# Patient Record
Sex: Female | Born: 1940 | Race: White | Hispanic: No | Marital: Married | Smoking: Never smoker
Health system: Southern US, Community
[De-identification: ages and names within clinical notes are randomized; demographics above are authoritative.]

## PROBLEM LIST (undated history)

## (undated) DIAGNOSIS — I341 Nonrheumatic mitral (valve) prolapse: Secondary | ICD-10-CM

## (undated) DIAGNOSIS — I10 Essential (primary) hypertension: Secondary | ICD-10-CM

## (undated) DIAGNOSIS — M810 Age-related osteoporosis without current pathological fracture: Secondary | ICD-10-CM

## (undated) DIAGNOSIS — N281 Cyst of kidney, acquired: Secondary | ICD-10-CM

## (undated) HISTORY — DX: Hypercalcemia: E83.52

## (undated) HISTORY — PX: TONSILLECTOMY: SUR1361

## (undated) HISTORY — DX: Nonrheumatic mitral (valve) prolapse: I34.1

## (undated) HISTORY — DX: Cyst of kidney, acquired: N28.1

## (undated) HISTORY — DX: Age-related osteoporosis without current pathological fracture: M81.0

---

## 2010-05-26 HISTORY — PX: GANGLION CYST EXCISION: SHX1691

## 2011-04-25 DIAGNOSIS — L821 Other seborrheic keratosis: Secondary | ICD-10-CM | POA: Diagnosis not present

## 2011-04-25 DIAGNOSIS — L570 Actinic keratosis: Secondary | ICD-10-CM | POA: Diagnosis not present

## 2011-05-20 DIAGNOSIS — Z20820 Contact with and (suspected) exposure to varicella: Secondary | ICD-10-CM | POA: Diagnosis not present

## 2011-05-20 DIAGNOSIS — E119 Type 2 diabetes mellitus without complications: Secondary | ICD-10-CM | POA: Diagnosis not present

## 2011-05-20 DIAGNOSIS — D509 Iron deficiency anemia, unspecified: Secondary | ICD-10-CM | POA: Diagnosis not present

## 2011-05-20 DIAGNOSIS — M109 Gout, unspecified: Secondary | ICD-10-CM | POA: Diagnosis not present

## 2011-05-20 DIAGNOSIS — E079 Disorder of thyroid, unspecified: Secondary | ICD-10-CM | POA: Diagnosis not present

## 2011-05-20 DIAGNOSIS — K769 Liver disease, unspecified: Secondary | ICD-10-CM | POA: Diagnosis not present

## 2011-05-20 DIAGNOSIS — R0609 Other forms of dyspnea: Secondary | ICD-10-CM | POA: Diagnosis not present

## 2011-05-20 DIAGNOSIS — E559 Vitamin D deficiency, unspecified: Secondary | ICD-10-CM | POA: Diagnosis not present

## 2011-05-20 DIAGNOSIS — E782 Mixed hyperlipidemia: Secondary | ICD-10-CM | POA: Diagnosis not present

## 2011-07-16 DIAGNOSIS — L570 Actinic keratosis: Secondary | ICD-10-CM | POA: Diagnosis not present

## 2011-08-06 DIAGNOSIS — R0602 Shortness of breath: Secondary | ICD-10-CM | POA: Diagnosis not present

## 2011-08-06 DIAGNOSIS — R319 Hematuria, unspecified: Secondary | ICD-10-CM | POA: Diagnosis not present

## 2011-08-06 DIAGNOSIS — J309 Allergic rhinitis, unspecified: Secondary | ICD-10-CM | POA: Diagnosis not present

## 2011-08-06 DIAGNOSIS — I10 Essential (primary) hypertension: Secondary | ICD-10-CM | POA: Diagnosis not present

## 2011-08-06 DIAGNOSIS — M818 Other osteoporosis without current pathological fracture: Secondary | ICD-10-CM | POA: Diagnosis not present

## 2011-08-06 DIAGNOSIS — Z0000 Encounter for general adult medical examination without abnormal findings: Secondary | ICD-10-CM | POA: Diagnosis not present

## 2011-08-06 DIAGNOSIS — I4589 Other specified conduction disorders: Secondary | ICD-10-CM | POA: Diagnosis not present

## 2011-08-06 DIAGNOSIS — R918 Other nonspecific abnormal finding of lung field: Secondary | ICD-10-CM | POA: Diagnosis not present

## 2011-10-21 ENCOUNTER — Emergency Department (HOSPITAL_BASED_OUTPATIENT_CLINIC_OR_DEPARTMENT_OTHER)
Admission: EM | Admit: 2011-10-21 | Discharge: 2011-10-21 | Disposition: A | Discharge status: Home / Self Care | Payer: Medicare Other | Attending: Emergency Medicine | Admitting: Emergency Medicine

## 2011-10-21 ENCOUNTER — Encounter (HOSPITAL_BASED_OUTPATIENT_CLINIC_OR_DEPARTMENT_OTHER): Payer: Self-pay

## 2011-10-21 DIAGNOSIS — T148 Other injury of unspecified body region: Secondary | ICD-10-CM | POA: Diagnosis not present

## 2011-10-21 DIAGNOSIS — Z880 Allergy status to penicillin: Secondary | ICD-10-CM | POA: Diagnosis not present

## 2011-10-21 DIAGNOSIS — S90569A Insect bite (nonvenomous), unspecified ankle, initial encounter: Secondary | ICD-10-CM | POA: Diagnosis not present

## 2011-10-21 DIAGNOSIS — W57XXXA Bitten or stung by nonvenomous insect and other nonvenomous arthropods, initial encounter: Secondary | ICD-10-CM | POA: Insufficient documentation

## 2011-10-21 DIAGNOSIS — I10 Essential (primary) hypertension: Secondary | ICD-10-CM | POA: Diagnosis not present

## 2011-10-21 DIAGNOSIS — S30860A Insect bite (nonvenomous) of lower back and pelvis, initial encounter: Secondary | ICD-10-CM | POA: Diagnosis not present

## 2011-10-21 HISTORY — DX: Essential (primary) hypertension: I10

## 2011-10-21 MED ORDER — TRIAMCINOLONE ACETONIDE 0.1 % EX CREA: TOPICAL_CREAM | Freq: Two times a day (BID) | CUTANEOUS | Status: DC

## 2011-10-21 NOTE — ED Notes (Signed)
Red, raised, urticarial rash on extremities and back.  Onset yesterday.

## 2011-10-21 NOTE — ED Provider Notes (Signed)
History     CSN: [id number]  Arrival date & time 10/21/11  1133   First MD Initiated Contact with Patient 10/21/11 1205      Chief Complaint  Patient presents with  . Rash    (Consider location/radiation/quality/duration/timing/severity/associated sxs/prior treatment) Patient is a 71 y.o. female presenting with rash. The history is provided by the patient. No language interpreter was used.  Rash  This is a new problem. The current episode started yesterday. The problem has not changed since onset.The problem is associated with nothing. There has been no fever. Associated symptoms include itching. She has tried nothing for the symptoms.   Pt complains of multiple bites right back and left inner thigh.  (Pt's husband worried that pt would get some type of infection in her blood) Past Medical History  Diagnosis Date  . Hypertension     History reviewed. No pertinent past surgical history.  No family history on file.  History  Substance Use Topics  . Smoking status: Never Smoker   . Smokeless tobacco: Never Used  . Alcohol Use: 0.6 oz/week    1 Glasses of wine per week     daily    OB History    Grav Para Term Preterm Abortions TAB SAB Ect Mult Living                  Review of Systems  Skin: Positive for itching and rash.  All other systems reviewed and are negative.    Allergies  Penicillins  Home Medications   Current Outpatient Rx  Name Route Sig Dispense Refill  . B-COMPLEX/B-12 PO Oral Take by mouth daily.    Marland Kitchen BIOTIN PO Oral Take by mouth daily.    Marland Kitchen CALTRATE 600 PO Oral Take by mouth.    . OMEGA-3 FATTY ACIDS 1000 MG PO CAPS Oral Take 1 g by mouth daily.    Marland Kitchen DIOVAN HCT PO Oral Take 20 mg by mouth.      BP 160/110  Pulse 85  Temp 98.2 F (36.8 C) (Oral)  Resp 16  Ht 5\' 6"  (1.676 m)  Wt 130 lb (58.968 kg)  BMI 20.98 kg/m2  SpO2 100%  Physical Exam  Nursing note and vitals reviewed. Constitutional: She appears well-developed and  well-nourished.  HENT:  Head: Normocephalic and atraumatic.  Eyes: Pupils are equal, round, and reactive to light.  Cardiovascular: Normal rate.   Pulmonary/Chest: Effort normal.  Neurological: She is alert.  Skin: There is erythema.       Multiple bites left inner thigh and right back (classic appearance of mosquito bites)  Psychiatric: She has a normal mood and affect.    ED Course  Procedures (including critical care time)  Labs Reviewed - No data to display No results found.   1. Insect bites       MDM  I advised benadryl for itching   traiamcinalone for rash       Lonia Skinner [place], [place] 10/21/11 1245

## 2011-10-21 NOTE — ED Provider Notes (Signed)
Medical screening examination/treatment/procedure(s) were performed by non-physician practitioner and as supervising physician I was immediately available for consultation/collaboration.   Anglea Gordner H Sherilynn Dieu, MD 10/21/11 1531 

## 2012-01-02 DIAGNOSIS — E559 Vitamin D deficiency, unspecified: Secondary | ICD-10-CM | POA: Diagnosis not present

## 2012-01-02 DIAGNOSIS — E119 Type 2 diabetes mellitus without complications: Secondary | ICD-10-CM | POA: Diagnosis not present

## 2012-01-02 DIAGNOSIS — R5381 Other malaise: Secondary | ICD-10-CM | POA: Diagnosis not present

## 2012-01-02 DIAGNOSIS — E079 Disorder of thyroid, unspecified: Secondary | ICD-10-CM | POA: Diagnosis not present

## 2012-01-02 DIAGNOSIS — I10 Essential (primary) hypertension: Secondary | ICD-10-CM | POA: Diagnosis not present

## 2012-01-02 DIAGNOSIS — I059 Rheumatic mitral valve disease, unspecified: Secondary | ICD-10-CM | POA: Diagnosis not present

## 2012-01-02 DIAGNOSIS — E785 Hyperlipidemia, unspecified: Secondary | ICD-10-CM | POA: Diagnosis not present

## 2012-01-02 DIAGNOSIS — Z23 Encounter for immunization: Secondary | ICD-10-CM | POA: Diagnosis not present

## 2012-01-02 DIAGNOSIS — R5383 Other fatigue: Secondary | ICD-10-CM | POA: Diagnosis not present

## 2012-01-02 DIAGNOSIS — IMO0001 Reserved for inherently not codable concepts without codable children: Secondary | ICD-10-CM | POA: Diagnosis not present

## 2012-01-02 DIAGNOSIS — R350 Frequency of micturition: Secondary | ICD-10-CM | POA: Diagnosis not present

## 2012-02-25 LAB — HM PAP SMEAR: HM PAP: NORMAL

## 2012-02-25 LAB — HM DEXA SCAN: HM Dexa Scan: NORMAL

## 2012-04-07 ENCOUNTER — Ambulatory Visit (HOSPITAL_BASED_OUTPATIENT_CLINIC_OR_DEPARTMENT_OTHER)
Admission: RE | Admit: 2012-04-07 | Discharge: 2012-04-07 | Disposition: A | Discharge status: Home / Self Care | Payer: Medicare Other | Source: Ambulatory Visit | Attending: Family | Admitting: Family

## 2012-04-07 ENCOUNTER — Ambulatory Visit (INDEPENDENT_AMBULATORY_CARE_PROVIDER_SITE_OTHER): Payer: Medicare Other | Admitting: Family

## 2012-04-07 ENCOUNTER — Encounter: Payer: Self-pay | Admitting: Family

## 2012-04-07 VITALS — BP 142/82 | HR 89 | Temp 98.1°F | Resp 16 | Ht 66.0 in | Wt 134.1 lb

## 2012-04-07 DIAGNOSIS — I10 Essential (primary) hypertension: Secondary | ICD-10-CM

## 2012-04-07 DIAGNOSIS — R059 Cough, unspecified: Secondary | ICD-10-CM

## 2012-04-07 DIAGNOSIS — R05 Cough: Secondary | ICD-10-CM

## 2012-04-07 MED ORDER — FLUTICASONE PROPIONATE 50 MCG/ACT NA SUSP: 2.0000 | Freq: Every day | NASAL | Status: DC

## 2012-04-07 NOTE — Progress Notes (Signed)
Subjective:    Patient ID: Mary Stevens, female    DOB: 02-Jun-1940, 72 y.o.   MRN: [id number]  HPI  Mary Stevens is a 72 yr old female here today to establish care. Moved here 18 months ago.    Cough- reports that this stated 2-3 months ago.  Cough is intermittent.  Reports no aggravating or alleviating factors.  Cough is non-productive. She denies fever.  She reports "burping more than she used to."  This started about 3 yrs ago.  She notes that symptoms are improved by align. Denies "heartburn."  She denies post nasal drip or sinus congestion.  She denies wheezing.  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 Reports hx of neg PFT's for asthma as well.    HTN- Se reports that she started treatment 4-5 yrs ago.  Review of Systems See HPI  Past Medical History  Diagnosis Date  . Hypertension     History   Social History  . Marital Status: Married    Spouse Name: N/A    Number of Children: 3  . Years of Education: N/A   Occupational History  . Not on file.   Social History Main Topics  . Smoking status: Never Smoker   . Smokeless tobacco: Never Used  . Alcohol Use: 0.6 oz/week    1 Glasses of wine per week     Comment: daily  . Drug Use: No  . Sexually Active: Not on file   Other Topics Concern  . Not on file   Social History Narrative  . No narrative on file    Past Surgical History  Procedure Laterality Date  . Ganglion cyst excision Right 4 2012    wrist    Family History  Problem Relation Age of Onset  . Heart disease Mother   . Cancer Father     liver  . Diabetes Neg Hx   . Kidney disease Neg Hx   . Asthma Neg Hx     Allergies  Allergen Reactions  . Penicillins Hives    Current Outpatient Prescriptions on File Prior to Visit  Medication Sig Dispense Refill  . B Complex Vitamins (B-COMPLEX/B-12 PO) Take by mouth daily.      Marland Kitchen BIOTIN PO Take by mouth daily.      . Calcium Carbonate (CALTRATE  600 PO) Take by mouth.      . fish oil-omega-3 fatty acids 1000 MG capsule Take 1 g by mouth daily.       No current facility-administered medications on file prior to visit.    BP 142/82  Pulse 89  Temp(Src) 98.1 F (36.7 C) (Oral)  Resp 16  Ht 5\' 6"  (1.676 m)  Wt 134 lb 1.9 oz (60.836 kg)  BMI 21.66 kg/m2  SpO2 99%       Objective:   Physical Exam  Constitutional: She is oriented to person, place, and time. She appears well-developed and well-nourished. No distress.  HENT:  Head: Normocephalic and atraumatic.  Right Ear: Tympanic membrane and ear canal normal.  Left Ear: Tympanic membrane and ear canal normal.  Mouth/Throat: No oropharyngeal exudate, posterior oropharyngeal edema or posterior oropharyngeal erythema.  Cardiovascular: Normal rate and regular rhythm.   No murmur heard. Pulmonary/Chest: Effort normal and breath sounds normal. No respiratory distress. She has no wheezes. She has no rales. She exhibits no tenderness.  Musculoskeletal: She exhibits no edema.  Neurological: She is alert and oriented to  person, place, and time.  Psychiatric: She has a normal mood and affect. Her behavior is normal. Judgment and thought content normal.          Assessment & Plan:

## 2012-04-07 NOTE — Patient Instructions (Addendum)
Please follow up in 4-6 weeks for a fasting physical. Welcome to [facility]!

## 2012-04-07 NOTE — Assessment & Plan Note (Signed)
BP Readings from Last 3 Encounters:  04/07/12 142/82  10/21/11 160/110   Fair BP on current meds.  Continue same, plan to check bmet next visit.

## 2012-04-07 NOTE — Assessment & Plan Note (Signed)
Will obtain cxr to further evaluate.  Rx flonase and claritin to help with post nasal drip- noted pt clearing throat and swallowing frequently during exam.  Also, empiric rx with otc prilosec.

## 2012-04-13 ENCOUNTER — Ambulatory Visit: Payer: Medicare Other | Admitting: Family

## 2012-05-04 ENCOUNTER — Other Ambulatory Visit: Payer: Self-pay | Admitting: Family

## 2012-05-04 ENCOUNTER — Ambulatory Visit (INDEPENDENT_AMBULATORY_CARE_PROVIDER_SITE_OTHER): Payer: Medicare Other | Admitting: Family

## 2012-05-04 ENCOUNTER — Encounter: Payer: Self-pay | Admitting: Family

## 2012-05-04 ENCOUNTER — Other Ambulatory Visit (HOSPITAL_COMMUNITY)
Admission: RE | Admit: 2012-05-04 | Discharge: 2012-05-04 | Disposition: A | Discharge status: Home / Self Care | Payer: Medicare Other | Source: Ambulatory Visit | Attending: Family | Admitting: Family

## 2012-05-04 VITALS — BP 110/76 | HR 77 | Temp 97.8°F | Resp 16 | Ht 66.0 in | Wt 137.0 lb

## 2012-05-04 DIAGNOSIS — I10 Essential (primary) hypertension: Secondary | ICD-10-CM

## 2012-05-04 DIAGNOSIS — Z0000 Encounter for general adult medical examination without abnormal findings: Secondary | ICD-10-CM | POA: Diagnosis not present

## 2012-05-04 DIAGNOSIS — R05 Cough: Secondary | ICD-10-CM | POA: Diagnosis not present

## 2012-05-04 DIAGNOSIS — Z1231 Encounter for screening mammogram for malignant neoplasm of breast: Secondary | ICD-10-CM

## 2012-05-04 DIAGNOSIS — M949 Disorder of cartilage, unspecified: Secondary | ICD-10-CM | POA: Diagnosis not present

## 2012-05-04 DIAGNOSIS — Z1239 Encounter for other screening for malignant neoplasm of breast: Secondary | ICD-10-CM

## 2012-05-04 DIAGNOSIS — M899 Disorder of bone, unspecified: Secondary | ICD-10-CM

## 2012-05-04 DIAGNOSIS — Z01419 Encounter for gynecological examination (general) (routine) without abnormal findings: Secondary | ICD-10-CM

## 2012-05-04 DIAGNOSIS — M8580 Other specified disorders of bone density and structure, unspecified site: Secondary | ICD-10-CM

## 2012-05-04 DIAGNOSIS — Z124 Encounter for screening for malignant neoplasm of cervix: Secondary | ICD-10-CM | POA: Insufficient documentation

## 2012-05-04 DIAGNOSIS — M810 Age-related osteoporosis without current pathological fracture: Secondary | ICD-10-CM

## 2012-05-04 LAB — BASIC METABOLIC PANEL
BUN: 19 mg/dL (ref 6–23)
Chloride: 107 mEq/L (ref 96–112)
Creat: 0.83 mg/dL (ref 0.50–1.10)
Glucose, Bld: 80 mg/dL (ref 70–99)

## 2012-05-04 NOTE — Progress Notes (Signed)
Subjective:    Patient ID: Mary Stevens, female    DOB: [birth year], 72 y.o.   MRN: [id number]  HPI Subjective:   Patient here for Medicare annual wellness visit and management of other chronic and acute problems.  Pt here for fasting medicare wellness exam. Pt reports last eye exam was about 1 yr ago. Pt is unsure of last tetanus, up to date with pneumovax, flu and shingles vaccines. Last colonoscopy 3 years ago, last dexa and mammogram 03/2011.   HTN- she is maintained on diovan. Tolerating.  Denies CP/SOB or swelling.    Cough- last visit she had CXR which showed ? COPD changes, but no acute findings.  Flonase and zyrtec were added to her regimen. She reports improvement in her symptoms.    Due for pap smear today.   Risk factors: ? Increased risk for heart disease given age and HTN.  Roster of Physicians Providing Medical Care to Patient:  cardiology  Activities of Daily Living  In your present state of health, do you have any difficulty performing the following activities? Preparing food and eating?: No  Bathing yourself: No  Getting dressed: No  Using the toilet:No  Moving around from place to place: No  In the past year have you fallen or had a near fall?:No     Home Safety: Has smoke detector and wears seat belts. No firearms. No excess sun exposure.  Diet and Exercise  Current exercise habits: enjoys walking Dietary issues discussed: healthy diet   Depression Screen  (Note: if answer to either of the following is "Yes", then a more complete depression screening is indicated)  Q1: Over the past two weeks, have you felt down, depressed or hopeless?no  Q2: Over the past two weeks, have you felt little interest or pleasure in doing things? no   The following portions of the patient's history were reviewed and updated as appropriate: allergies, current medications, past family history, past medical history, past social history, past surgical history and problem list.     Objective:   Vision: see nursing. Hearing: Able to hear forced whisper at 6pm Body mass index: see nursing. Cognitive Impairment Assessment: cognition, memory and judgment appear normal.   Assessment:   Medicare wellness utd on preventive parameters   Plan:   During the course of the visit the patient was educated and counseled about appropriate screening and preventive services including:   Declines tdap       Pap today.   Screening mammography- ordered Bone densitometry screening- ordered Diabetes screening - oprdered Nutrition counseling   Vaccines / LABS  BMET today   Instructions (the written plan) was given to the patient.       Review of Systems  Constitutional: Negative for unexpected weight change.  HENT: Negative for congestion.   Eyes: Negative for visual disturbance.  Respiratory: Negative for cough.   Cardiovascular: Negative for chest pain and leg swelling.  Gastrointestinal: Negative for nausea, vomiting and diarrhea.  Genitourinary: Negative for frequency.  Musculoskeletal: Negative for arthralgias.  Skin: Negative for rash.  Neurological: Negative for headaches.  Hematological: Negative for adenopathy.  Psychiatric/Behavioral:       Denies depression       Objective:   Physical Exam  Constitutional: She is oriented to person, place, and time. She appears well-developed and well-nourished. No distress.  HENT:  Head: Normocephalic and atraumatic.  Right Ear: Tympanic membrane and ear canal normal.  Left Ear: Tympanic membrane and ear canal normal.  Mouth/Throat: No  oropharyngeal exudate or posterior oropharyngeal edema.  Cardiovascular: Normal rate and regular rhythm.   No murmur heard. Pulmonary/Chest: Effort normal and breath sounds normal. No respiratory distress. She has no wheezes. She has no rales. She exhibits no tenderness.  Musculoskeletal: She exhibits no edema.  Neurological: She is alert and oriented to person, place, and  time.  Skin: Skin is warm and dry. No rash noted. No erythema. No pallor.  Psychiatric: She has a normal mood and affect. Her behavior is normal. Judgment and thought content normal.  Breasts: Examined lying and sitting.  Right: Without masses, retractions, discharge or axillary adenopathy.  Left: Without masses, retractions, discharge or axillary adenopathy.  Inguinal/mons: Normal without inguinal adenopathy  External genitalia: Normal  BUS/Urethra/Skene's glands: Normal  Bladder: Normal  Vagina: Normal- atrophic Cervix: Normal  Uterus: normal in size, shape and contour. Midline and mobile  Adnexa/parametria:  Rt: Without masses or tenderness.  Lt: Without masses or tenderness.  Anus and perineum: Normal          Assessment & Plan:

## 2012-05-04 NOTE — Patient Instructions (Addendum)
Please schedule bone density at the front desk. Schedule mammogram on the first floor.  Follow up in 6 months.

## 2012-05-04 NOTE — Assessment & Plan Note (Signed)
Resolved. Likely due to post nasal drip. Continue flonase and Zyrtec.

## 2012-05-04 NOTE — Assessment & Plan Note (Signed)
Stable on diovan.  Obtain BMET.

## 2012-05-24 ENCOUNTER — Ambulatory Visit (INDEPENDENT_AMBULATORY_CARE_PROVIDER_SITE_OTHER): Payer: Medicare Other | Admitting: Family

## 2012-05-24 ENCOUNTER — Encounter: Payer: Self-pay | Admitting: Family

## 2012-05-24 VITALS — BP 126/80 | HR 77 | Temp 97.8°F | Resp 18 | Ht 66.0 in | Wt 137.0 lb

## 2012-05-24 DIAGNOSIS — R05 Cough: Secondary | ICD-10-CM | POA: Diagnosis not present

## 2012-05-24 DIAGNOSIS — T148XXA Other injury of unspecified body region, initial encounter: Secondary | ICD-10-CM | POA: Diagnosis not present

## 2012-05-24 MED ORDER — MELOXICAM 7.5 MG PO TABS: 7.5000 mg | ORAL_TABLET | Freq: Every day | ORAL | Status: DC

## 2012-05-24 MED ORDER — CYCLOBENZAPRINE HCL 5 MG PO TABS: ORAL_TABLET | ORAL | Status: DC

## 2012-05-24 NOTE — Progress Notes (Signed)
Subjective:    Patient ID: Mary Stevens, female    DOB: [birth year], 72 y.o.   MRN: [id number]  HPI  Ms.  Mary Stevens is a 72 yr old female who presents today with chief complaint of headache.  She reports that pain is located in the left occipital region and is tender to the touch. Reports that symptoms started about 2 weeks ago.  Reports that last Sunday she had some sharp pains.  She denies known injury. Denies fever.   Cough- got bloody nose on flonase- she stopped. Cough is improved with prilosec and zyrtec.    Review of Systems    see HPI  Past Medical History  Diagnosis Date  . Hypertension     History   Social History  . Marital Status: Married    Spouse Name: N/A    Number of Children: 3  . Years of Education: N/A   Occupational History  . Not on file.   Social History Main Topics  . Smoking status: Never Smoker   . Smokeless tobacco: Never Used  . Alcohol Use: 0.6 oz/week    1 Glasses of wine per week     Comment: daily  . Drug Use: No  . Sexually Active: Not on file   Other Topics Concern  . 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      Past Surgical History  Procedure Laterality Date  . Ganglion cyst excision Right 4 2012    wrist    Family History  Problem Relation Age of Onset  . Heart disease Mother   . Cancer Father     liver  . Diabetes Neg Hx   . Kidney disease Neg Hx   . Asthma Neg Hx     Allergies  Allergen Reactions  . Penicillins Hives    Current Outpatient Prescriptions on File Prior to Visit  Medication Sig Dispense Refill  . B Complex Vitamins (B-COMPLEX/B-12 PO) Take by mouth daily.      Marland Kitchen BIOTIN PO Take by mouth daily.      . Calcium Carbonate (CALTRATE 600 PO) Take by mouth.      . cetirizine (ZYRTEC) 10 MG tablet Take 10 mg by mouth daily as needed  for allergies.      . fish oil-omega-3 fatty acids 1000 MG capsule Take 1 g by mouth daily.      Marland Kitchen omeprazole (PRILOSEC) 20 MG capsule Take 20 mg by mouth daily.      . valsartan (DIOVAN) 40 MG tablet Take 40 mg by mouth daily.       No current facility-administered medications on file prior to visit.    BP 126/80  Pulse 77  Temp(Src) 97.8 F (36.6 C) (Oral)  Resp 18  Ht 5\' 6"  (1.676 m)  Wt 137 lb (62.143 kg)  BMI 22.12 kg/m2  SpO2 99%    Objective:   Physical Exam  Constitutional: She is oriented to person, place, and time. She appears well-developed and well-nourished. No distress.  HENT:  Head: Normocephalic and atraumatic.  Mouth/Throat: No oropharyngeal exudate.  Cardiovascular: Normal rate and regular rhythm.   No murmur heard. Pulmonary/Chest: Effort normal and breath sounds normal. No respiratory distress. She has no wheezes. She has no rales. She exhibits no tenderness.  Musculoskeletal:  Cervical back: She exhibits normal range of motion and no tenderness.  Bilateral UE strength is 5/5, bilateral LE strength is 5/5  Lymphadenopathy:       Head (right side): No occipital adenopathy present.       Head (left side): No occipital adenopathy present.  Neurological: She is alert and oriented to person, place, and time.  Psychiatric: She has a normal mood and affect. Her behavior is normal. Judgment and thought content normal.          Assessment & Plan:

## 2012-05-24 NOTE — Patient Instructions (Addendum)
Please call if symptoms worsen or if not improved in 1-2 weeks. 

## 2012-05-24 NOTE — Assessment & Plan Note (Signed)
Improved with zyrtec and prilosec.  Continue same.

## 2012-05-24 NOTE — Assessment & Plan Note (Signed)
Suspect pain is originating from C spine/strain. Will give trial of meloxicam and prn flexeril. Pt is instructed to call if symptoms worsen or if not improved in 1-2 weeks.

## 2012-06-14 ENCOUNTER — Ambulatory Visit (HOSPITAL_BASED_OUTPATIENT_CLINIC_OR_DEPARTMENT_OTHER): Payer: Medicare Other

## 2012-06-17 ENCOUNTER — Ambulatory Visit (HOSPITAL_BASED_OUTPATIENT_CLINIC_OR_DEPARTMENT_OTHER)
Admission: RE | Admit: 2012-06-17 | Discharge: 2012-06-17 | Disposition: A | Discharge status: Home / Self Care | Payer: Medicare Other | Source: Ambulatory Visit | Attending: Family | Admitting: Family

## 2012-06-17 DIAGNOSIS — Z1231 Encounter for screening mammogram for malignant neoplasm of breast: Secondary | ICD-10-CM | POA: Diagnosis not present

## 2012-06-21 ENCOUNTER — Other Ambulatory Visit: Payer: Self-pay | Admitting: Family

## 2012-06-22 ENCOUNTER — Ambulatory Visit (INDEPENDENT_AMBULATORY_CARE_PROVIDER_SITE_OTHER)
Admission: RE | Admit: 2012-06-22 | Discharge: 2012-06-22 | Disposition: A | Discharge status: Home / Self Care | Payer: Medicare Other | Source: Ambulatory Visit | Attending: Family | Admitting: Family

## 2012-06-22 DIAGNOSIS — M949 Disorder of cartilage, unspecified: Secondary | ICD-10-CM

## 2012-06-22 DIAGNOSIS — M8580 Other specified disorders of bone density and structure, unspecified site: Secondary | ICD-10-CM

## 2012-06-22 DIAGNOSIS — M899 Disorder of bone, unspecified: Secondary | ICD-10-CM | POA: Diagnosis not present

## 2012-07-11 ENCOUNTER — Encounter: Payer: Self-pay | Admitting: Family

## 2012-07-16 ENCOUNTER — Telehealth: Payer: Self-pay | Admitting: Family

## 2012-07-16 MED ORDER — ALENDRONATE SODIUM 70 MG PO TABS: 70.0000 mg | ORAL_TABLET | ORAL | Status: DC

## 2012-07-16 NOTE — Telephone Encounter (Signed)
Spoke to patient results and fosamax rx. Patient stated that she had taken fosamax in the past for many years and she did not like it. Patient wants to know if there was something else she can try instead? Please advise?

## 2012-07-16 NOTE — Telephone Encounter (Signed)
Spoke with pt. We discussed possibility of prolia.  She will review the pt info on website and  Let me know if she would like to proceed.

## 2012-07-16 NOTE — Telephone Encounter (Signed)
Reviewed frax score related to bone density.  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  Rx sent.  Left message requesting that pt return our call.

## 2012-08-18 ENCOUNTER — Encounter: Payer: Self-pay | Admitting: Family

## 2012-09-02 DIAGNOSIS — L820 Inflamed seborrheic keratosis: Secondary | ICD-10-CM | POA: Diagnosis not present

## 2012-09-02 DIAGNOSIS — L578 Other skin changes due to chronic exposure to nonionizing radiation: Secondary | ICD-10-CM | POA: Diagnosis not present

## 2012-09-02 DIAGNOSIS — L570 Actinic keratosis: Secondary | ICD-10-CM | POA: Diagnosis not present

## 2012-09-20 ENCOUNTER — Encounter: Payer: Self-pay | Admitting: Family

## 2012-09-20 ENCOUNTER — Ambulatory Visit (INDEPENDENT_AMBULATORY_CARE_PROVIDER_SITE_OTHER): Payer: Medicare Other | Admitting: Family

## 2012-09-20 DIAGNOSIS — T6391XA Toxic effect of contact with unspecified venomous animal, accidental (unintentional), initial encounter: Secondary | ICD-10-CM | POA: Diagnosis not present

## 2012-09-20 DIAGNOSIS — T63461A Toxic effect of venom of wasps, accidental (unintentional), initial encounter: Secondary | ICD-10-CM | POA: Insufficient documentation

## 2012-09-20 MED ORDER — PREDNISONE (PAK) 10 MG PO TABS: 10.0000 mg | ORAL_TABLET | Freq: Every day | ORAL | Status: DC

## 2012-09-20 NOTE — Progress Notes (Signed)
Subjective:    Patient ID: Mary Stevens, female    DOB: 1940-05-05, 72 y.o.   MRN: [id number]  HPI  [name] is a 72 yr old female who presents today following a wasp sting on Friday. She reports some swelling, redness, and discomfort surrounding the area.  She denies associated swelling, shortness of breath, wheezing or hives.   She reports that she has developed some bruising, and increased swelling surrounding the site since yesterday. The area is described as tender.   Review of Systems See HPI  Past Medical History  Diagnosis Date  . Hypertension     History   Social History  . Marital Status: Married    Spouse Name: N/A    Number of Children: 3  . Years of Education: N/A   Occupational History  . Not on file.   Social History Main Topics  . Smoking status: Never Smoker   . Smokeless tobacco: Never Used  . Alcohol Use: 0.6 oz/week    1 Glasses of wine per week     Comment: daily  . Drug Use: No  . Sexually Active: Not on file   Other Topics Concern  . 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      Past Surgical History  Procedure Laterality Date  . Ganglion cyst excision Right 4 2012    wrist    Family History  Problem Relation Age of Onset  . Heart disease Mother   . Cancer Father     liver  . Diabetes Neg Hx   . Kidney disease Neg Hx   . Asthma Neg Hx     Allergies  Allergen Reactions  . Penicillins Hives    Current Outpatient Prescriptions on File Prior to Visit  Medication Sig Dispense Refill  . B Complex Vitamins (B-COMPLEX/B-12 PO) Take by mouth daily.      Marland Kitchen BIOTIN PO Take by mouth daily.      . Calcium Carbonate (CALTRATE 600 PO) Take by mouth.      . cetirizine (ZYRTEC) 10 MG tablet Take 10 mg by mouth daily as needed for allergies.      . fish oil-omega-3  fatty acids 1000 MG capsule Take 1 g by mouth daily.      Marland Kitchen omeprazole (PRILOSEC) 20 MG capsule Take 20 mg by mouth daily.      . valsartan (DIOVAN) 40 MG tablet Take 40 mg by mouth daily.      Marland Kitchen alendronate (FOSAMAX) 70 MG tablet Take 1 tablet (70 mg total) by mouth every 7 (seven) days. Take with a full glass of water on an empty stomach.  4 tablet  11   No current facility-administered medications on file prior to visit.    BP 125/71  Pulse 77  Temp(Src) 97.5 F (36.4 C) (Oral)  Resp 16  Ht 5\' 6"  (1.676 m)  Wt 134 lb 1.3 oz (60.818 kg)  BMI 21.65 kg/m2  SpO2 99%       Objective:   Physical Exam  Constitutional: She is oriented to person, place, and time. She appears well-developed and well-nourished. No distress.  Cardiovascular: Normal rate and regular rhythm.   No murmur heard. Pulmonary/Chest: Effort normal and breath sounds normal. No respiratory distress. She has no wheezes. She has no rales. She exhibits no  tenderness.  Musculoskeletal: She exhibits no edema.  Neurological: She is alert and oriented to person, place, and time.  Skin:  Mild erythema and swelling of the dorsal aspect of the left hand.  Mild ecchymosis is noted overlying left dorsal wrist.    Psychiatric: She has a normal mood and affect. Her behavior is normal. Judgment and thought content normal.          Assessment & Plan:

## 2012-09-20 NOTE — Patient Instructions (Addendum)
You may take Benadryl 25mg  1-2 tabs every 6 hours as needed. Call if you develop shortness of breath, wheezing, hives, increased swelling/redness.   Bee, Wasp, or Hornet Sting Your caregiver has diagnosed you as having an insect sting. An insect sting appears as a red lump in the skin that sometimes has a tiny hole in the center, or it may have a stinger in the center of the wound. The most common stings are from wasps, hornets and bees. Individuals have different reactions to insect stings.  A normal reaction may cause pain, swelling, and redness around the sting site.  A localized allergic reaction may cause swelling and redness that extends beyond the sting site.  A large local reaction may continue to develop over the next 12 to 36 hours.  On occasion, the reactions can be severe (anaphylactic reaction). An anaphylactic reaction may cause wheezing; difficulty breathing; chest pain; fainting; raised, itchy, red patches on the skin; a sick feeling to your stomach (nausea); vomiting; cramping; or diarrhea. If you have had an anaphylactic reaction to an insect sting in the past, you are more likely to have one again. HOME CARE INSTRUCTIONS   With bee stings, a small sac of poison is left in the wound. Brushing across this with something such as a credit card, or anything similar, will help remove this and decrease the amount of the reaction. This same procedure will not help a wasp sting as they do not leave behind a stinger and poison sac.  Apply a cold compress for 10 to 20 minutes every hour for 1 to 2 days, depending on severity, to reduce swelling and itching.  To lessen pain, a paste made of water and baking soda may be rubbed on the bite or sting and left on for 5 minutes.  To relieve itching and swelling, you may use take medication or apply medicated creams or lotions as directed.  Only take over-the-counter or prescription medicines for pain, discomfort, or fever as directed by your  caregiver.  Wash the sting site daily with soap and water. Apply antibiotic ointment on the sting site as directed.  If you suffered a severe reaction:  If you did not require hospitalization, an adult will need to stay with you for 24 hours in case the symptoms return.  You may need to wear a medical bracelet or necklace stating the allergy.  You and your family need to learn when and how to use an anaphylaxis kit or epinephrine injection.  If you have had a severe reaction before, always carry your anaphylaxis kit with you. SEEK MEDICAL CARE IF:   None of the above helps within 2 to 3 days.  The area becomes red, warm, tender, and swollen beyond the area of the bite or sting.  You have an oral temperature above 102 F (38.9 C). SEEK IMMEDIATE MEDICAL CARE IF:  You have symptoms of an allergic reaction which are:  Wheezing.  Difficulty breathing.  Chest pain.  Lightheadedness or fainting.  Itchy, raised, red patches on the skin.  Nausea, vomiting, cramping or diarrhea. ANY OF THESE SYMPTOMS MAY REPRESENT A SERIOUS PROBLEM THAT IS AN EMERGENCY. Do not wait to see if the symptoms will go away. Get medical help right away. Call your local emergency services (911 in [place] DO NOT drive yourself to the hospital. MAKE SURE YOU:   Understand these instructions.  Will watch your condition.  Will get help right away if you are not doing well or get  worse. Document Released: 02/10/2005 Document Revised: 05/05/2011 Document Reviewed: 07/28/2009 [facility] Patient Information 2014 [place], [place].

## 2012-09-20 NOTE — Assessment & Plan Note (Signed)
Pt with localized reaction.  Clinically appears to be reaction and not cellulitis at this point. She does have some itching and discomfort.   Will plan short course of prednisone along with prn benadryl. She is instructed to call if increased pain/swelling or redness. Pt verbalizes understanding.

## 2012-11-08 ENCOUNTER — Telehealth: Payer: Self-pay | Admitting: *Deleted

## 2012-11-08 ENCOUNTER — Encounter: Payer: Self-pay | Admitting: Family

## 2012-11-08 ENCOUNTER — Ambulatory Visit (INDEPENDENT_AMBULATORY_CARE_PROVIDER_SITE_OTHER): Payer: Medicare Other | Admitting: Family

## 2012-11-08 VITALS — BP 128/70 | HR 76 | Temp 98.0°F | Resp 16 | Ht 66.0 in | Wt 136.1 lb

## 2012-11-08 DIAGNOSIS — M810 Age-related osteoporosis without current pathological fracture: Secondary | ICD-10-CM

## 2012-11-08 DIAGNOSIS — Z23 Encounter for immunization: Secondary | ICD-10-CM

## 2012-11-08 DIAGNOSIS — I10 Essential (primary) hypertension: Secondary | ICD-10-CM

## 2012-11-08 DIAGNOSIS — Z5189 Encounter for other specified aftercare: Secondary | ICD-10-CM

## 2012-11-08 LAB — BASIC METABOLIC PANEL
BUN: 15 mg/dL (ref 6–23)
Calcium: 10.7 mg/dL — ABNORMAL HIGH (ref 8.4–10.5)
Potassium: 4.3 mEq/L (ref 3.5–5.3)
Sodium: 142 mEq/L (ref 135–145)

## 2012-11-08 MED ORDER — MELOXICAM 7.5 MG PO TABS: 7.5000 mg | ORAL_TABLET | Freq: Every day | ORAL | Status: DC | PRN

## 2012-11-08 NOTE — Assessment & Plan Note (Signed)
BP stable on current meds.  Obtain bmet. Continue diovan.

## 2012-11-08 NOTE — Telephone Encounter (Signed)
Pt requests that we verify her insurance benefits for Prolia. Advised pt we will notify her once we receive a response.

## 2012-11-08 NOTE — Patient Instructions (Addendum)
Please complete your lab work prior to leaving. Think about prolia and let me know if you want to proceed. Please follow up in 6 months for a fasting wellness exam.

## 2012-11-08 NOTE — Assessment & Plan Note (Signed)
We discussed pros/cons of prolia. She will consider and let me know if she wishes to proceed.

## 2012-11-08 NOTE — Assessment & Plan Note (Signed)
Resolved

## 2012-11-08 NOTE — Progress Notes (Signed)
Subjective:    Patient ID: Mary Stevens, female    DOB: 12/15/1940, 72 y.o.   MRN: [id number]  HPI  Mary Stevens is a 72 yr old female who presents today for follow up.  1) HTN- she is currently maintained on divan.  Denies CP/SOB/Swelling.    2) Wasp sting- resolved.  3) Osteoporosis- has been on fosamax for a long time.  Stopped taking. She is thinking about prolia instead.  Frax tool shows 10 yr fracture risk is 16% and hip fracture 4%.   Review of Systems See HPI  Past Medical History  Diagnosis Date  . Hypertension     History   Social History  . Marital Status: Married    Spouse Name: N/A    Number of Children: 3  . Years of Education: N/A   Occupational History  . Not on file.   Social History Main Topics  . Smoking status: Never Smoker   . Smokeless tobacco: Never Used  . Alcohol Use: 0.6 oz/week    1 Glasses of wine per week     Comment: daily  . Drug Use: No  . Sexual Activity: Not on file   Other Topics Concern  . 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      Past Surgical History  Procedure Laterality Date  . Ganglion cyst excision Right 4 2012    wrist    Family History  Problem Relation Age of Onset  . Heart disease Mother   . Cancer Father     liver  . Diabetes Neg Hx   . Kidney disease Neg Hx   . Asthma Neg Hx     Allergies  Allergen Reactions  . Penicillins Hives    Current Outpatient Prescriptions on File Prior to Visit  Medication Sig Dispense Refill  . B Complex Vitamins (B-COMPLEX/B-12 PO) Take by mouth daily.      Marland Kitchen BIOTIN PO Take by mouth daily.      . Calcium Carbonate (CALTRATE 600 PO) Take by mouth.      . cetirizine (ZYRTEC) 10 MG tablet Take 10 mg by mouth daily as needed for allergies.      . fish oil-omega-3 fatty acids 1000 MG capsule Take  1 g by mouth daily.      Marland Kitchen omeprazole (PRILOSEC) 20 MG capsule Take 20 mg by mouth daily.      . valsartan (DIOVAN) 40 MG tablet Take 40 mg by mouth daily.       No current facility-administered medications on file prior to visit.    BP 128/70  Pulse 76  Temp(Src) 98 F (36.7 C) (Oral)  Resp 16  Ht 5\' 6"  (1.676 m)  Wt 136 lb 1.9 oz (61.744 kg)  BMI 21.98 kg/m2  SpO2 99%       Objective:   Physical Exam  Constitutional: She is oriented to person, place, and time. She appears well-developed and well-nourished. No distress.  Cardiovascular: Normal rate and regular rhythm.   No murmur heard. Pulmonary/Chest: Effort normal and breath sounds normal. No respiratory distress. She has no wheezes. She has no rales. She exhibits no tenderness.  Musculoskeletal: She exhibits no edema.  Neurological: She is alert and oriented to person, place, and time.  Psychiatric: She has a normal mood and affect. Her behavior  is normal. Judgment and thought content normal.          Assessment & Plan:

## 2012-11-29 ENCOUNTER — Telehealth: Payer: Self-pay | Admitting: *Deleted

## 2012-11-29 NOTE — Telephone Encounter (Signed)
Pt presented to the lab today. Order entered as below:  Notes Recorded by Alita Chyle, CMA on 11/09/2012 at 10:31 AM Called pt and advised her that her calcium is mildly elevated. Advised her to hold off taking the caltrate (for 2 weeks) and to come back and repeat the ionized calcium test in 2 weeks. Pt understood.  Notes Recorded by Sandford Craze, NP on 11/09/2012 at 9:08 AM Please call pt and let her know that her calcium is mildly elevated. Please ask her to hold caltrate and repeat ionized calcium in 2 weeks

## 2012-11-30 LAB — CALCIUM, IONIZED: Calcium, Ion: 1.34 mmol/L — ABNORMAL HIGH (ref 1.12–1.32)

## 2012-12-07 ENCOUNTER — Telehealth: Payer: Self-pay | Admitting: *Deleted

## 2012-12-07 NOTE — Telephone Encounter (Signed)
Left detailed message on home # and lab order entered.

## 2012-12-07 NOTE — Telephone Encounter (Signed)
Message copied by Kathi Simpers on Tue Dec 07, 2012  5:26 PM ------      Message from: O'SULLIVAN, MELISSA      Created: Fri Dec 03, 2012  6:17 PM       Can lab add on pth?  If not, please ask pt to return for pth.  Calcium is still elevated. Thanks. ------

## 2012-12-10 LAB — PARATHYROID HORMONE, INTACT (NO CA): PTH: 53.7 pg/mL (ref 14.0–72.0)

## 2012-12-14 ENCOUNTER — Telehealth: Payer: Self-pay | Admitting: *Deleted

## 2012-12-14 DIAGNOSIS — H524 Presbyopia: Secondary | ICD-10-CM | POA: Diagnosis not present

## 2012-12-14 DIAGNOSIS — H04129 Dry eye syndrome of unspecified lacrimal gland: Secondary | ICD-10-CM | POA: Diagnosis not present

## 2012-12-14 DIAGNOSIS — H2510 Age-related nuclear cataract, unspecified eye: Secondary | ICD-10-CM | POA: Diagnosis not present

## 2012-12-14 NOTE — Telephone Encounter (Signed)
Form completed and faxed to [employer] [facility] at [phone] aloong with copy of insurance card.

## 2012-12-14 NOTE — Telephone Encounter (Signed)
Left message requesting recent lab results. Please advise.

## 2012-12-14 NOTE — Telephone Encounter (Signed)
Calcium remains mildly elevated.  Parathyroid test is normal. Lets have her stop calcium supplement, repeat ionized calcium in 1 month please.

## 2012-12-15 NOTE — Telephone Encounter (Signed)
Left detailed message on home # to return to lab in 1 month for repeat testing or call if any questions.

## 2013-01-17 ENCOUNTER — Encounter: Payer: Self-pay | Admitting: Family

## 2013-01-18 ENCOUNTER — Telehealth: Payer: Self-pay | Admitting: *Deleted

## 2013-01-18 NOTE — Telephone Encounter (Signed)
Pt aware and will discuss with Provider at her next follow up when she returns in December.

## 2013-01-18 NOTE — Telephone Encounter (Signed)
OK to hold off on prolia for now.

## 2013-01-18 NOTE — Telephone Encounter (Signed)
Spoke with pt regarding below results. She states she will not be able to proceed with additional labs until after 02/07/13 (orders entered) as they are going out of town. Pt also wanted to get Prolia injection but isn't sure if she should proceed until cause of elevated calcium level is determined?  Please advise.    Notes Recorded by Sandford Craze, NP on 01/17/2013 at 7:39 AM Please let pt know that her calcium remains mildly elevated. I would like her to return for PTH and Serum protein electrophoresis (dx hypercalcemia).

## 2013-02-04 LAB — PROTEIN ELECTROPHORESIS, SERUM
Albumin ELP: 63.5 % (ref 55.8–66.1)
Alpha-1-Globulin: 3.6 % (ref 2.9–4.9)
Alpha-2-Globulin: 9.1 % (ref 7.1–11.8)
Beta 2: 4.2 % (ref 3.2–6.5)
Beta Globulin: 6.6 % (ref 4.7–7.2)
Gamma Globulin: 13 % (ref 11.1–18.8)
Total Protein, Serum Electrophoresis: 6.9 g/dL (ref 6.0–8.3)

## 2013-02-07 ENCOUNTER — Ambulatory Visit (INDEPENDENT_AMBULATORY_CARE_PROVIDER_SITE_OTHER): Payer: Medicare Other | Admitting: Family

## 2013-02-07 ENCOUNTER — Encounter: Payer: Self-pay | Admitting: Family

## 2013-02-07 VITALS — BP 126/80 | HR 83 | Temp 97.9°F | Resp 16 | Ht 66.0 in | Wt 136.0 lb

## 2013-02-07 DIAGNOSIS — Z23 Encounter for immunization: Secondary | ICD-10-CM

## 2013-02-07 DIAGNOSIS — M810 Age-related osteoporosis without current pathological fracture: Secondary | ICD-10-CM

## 2013-02-07 NOTE — Progress Notes (Signed)
Pre visit review using our clinic review tool, if applicable. No additional management support is needed unless otherwise documented below in the visit note. 

## 2013-02-07 NOTE — Patient Instructions (Signed)
Please follow up in 3 months.  

## 2013-02-08 NOTE — Progress Notes (Signed)
Subjective:    Patient ID: Mary Stevens, female    DOB: 1940/10/09, 72 y.o.   MRN: [id number]  HPI  Mary Stevens is a 72 yr old female who presents today for follow up of hypercalcemia and to discuss prolia injection.  SPEP and PTH normal.  Was on calcium supplement which she has discontinued.    Has hx of osteoporosis. Has been on fosamax in the past. Would like to proceed with prolia.   Review of Systems See HPI  Past Medical History  Diagnosis Date  . Hypertension     History   Social History  . Marital Status: Married    Spouse Name: N/A    Number of Children: 3  . Years of Education: N/A   Occupational History  . Not on file.   Social History Main Topics  . Smoking status: Never Smoker   . Smokeless tobacco: Never Used  . Alcohol Use: 0.6 oz/week    1 Glasses of wine per week     Comment: daily  . Drug Use: No  . Sexual Activity: Not on file   Other Topics Concern  . 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      Past Surgical History  Procedure Laterality Date  . Ganglion cyst excision Right 4 2012    wrist    Family History  Problem Relation Age of Onset  . Heart disease Mother   . Cancer Father     liver  . Diabetes Neg Hx   . Kidney disease Neg Hx   . Asthma Neg Hx     Allergies  Allergen Reactions  . Penicillins Hives    Current Outpatient Prescriptions on File Prior to Visit  Medication Sig Dispense Refill  . B Complex Vitamins (B-COMPLEX/B-12 PO) Take by mouth daily.      Marland Kitchen BIOTIN PO Take by mouth daily.      . cetirizine (ZYRTEC) 10 MG tablet Take 10 mg by mouth daily as needed for allergies.      . fish oil-omega-3 fatty acids 1000 MG capsule Take 1 g by mouth daily.      . meloxicam (MOBIC) 7.5 MG tablet Take 1 tablet (7.5 mg total) by mouth daily as needed.   30 tablet  2  . omeprazole (PRILOSEC) 20 MG capsule Take 20 mg by mouth daily.      . valsartan (DIOVAN) 40 MG tablet Take 40 mg by mouth daily.      . Calcium Carbonate (CALTRATE 600 PO) Take by mouth.       No current facility-administered medications on file prior to visit.    BP 126/80  Pulse 83  Temp(Src) 97.9 F (36.6 C) (Oral)  Resp 16  Ht 5\' 6"  (1.676 m)  Wt 136 lb 0.6 oz (61.707 kg)  BMI 21.97 kg/m2  SpO2 99%       Objective:   Physical Exam  Constitutional: She is oriented to person, place, and time. She appears well-developed and well-nourished. No distress.  HENT:  Head: Normocephalic and atraumatic.  Cardiovascular: Normal rate and regular rhythm.   No murmur heard. Pulmonary/Chest: Effort normal and breath sounds normal. No respiratory distress. She has no wheezes. She has no rales. She exhibits no tenderness.  Musculoskeletal: She exhibits no edema.  Neurological: She is  alert and oriented to person, place, and time.  Psychiatric: She has a normal mood and affect. Her behavior is normal. Judgment and thought content normal.          Assessment & Plan:

## 2013-02-08 NOTE — Assessment & Plan Note (Signed)
Wants to confirm that her deductible has been met and will call to schedule nurse visit for administration

## 2013-02-08 NOTE — Assessment & Plan Note (Signed)
Likely due to calcium supplement. Remain off of calcium. Follow up in 3 months.

## 2013-02-15 ENCOUNTER — Ambulatory Visit (INDEPENDENT_AMBULATORY_CARE_PROVIDER_SITE_OTHER): Payer: Medicare Other | Admitting: Family

## 2013-02-15 DIAGNOSIS — M810 Age-related osteoporosis without current pathological fracture: Secondary | ICD-10-CM | POA: Diagnosis not present

## 2013-02-15 MED ORDER — DENOSUMAB 60 MG/ML ~~LOC~~ SOLN
60.0000 mg | Freq: Once | SUBCUTANEOUS | Status: AC
  Administered 2013-02-15: 60 mg via SUBCUTANEOUS

## 2013-02-28 ENCOUNTER — Encounter: Payer: Self-pay | Admitting: Family

## 2013-03-08 ENCOUNTER — Encounter: Payer: Self-pay | Admitting: Family

## 2013-04-07 ENCOUNTER — Other Ambulatory Visit: Payer: Self-pay | Admitting: Family

## 2013-04-08 NOTE — Telephone Encounter (Signed)
eScribe request from [facility] for refill on Diovan 80 mg Last filled - Never; pt EMR shows that patient is px Diovan 40  Mg as an Historical Only, no history of refills Last AEX - 21.15.14 Next AEX - 3 Months Please Advise on refills and what dosage is correct/SLS

## 2013-04-08 NOTE — Telephone Encounter (Signed)
Contacted pt to confirm which dose she is taking.  Left message on her home phone advising her to send me a mychart message over the weekend clarifying her current dosage.

## 2013-04-09 ENCOUNTER — Encounter: Payer: Self-pay | Admitting: Family

## 2013-04-11 MED ORDER — VALSARTAN 40 MG PO TABS: 40.0000 mg | ORAL_TABLET | Freq: Every day | ORAL | Status: DC

## 2013-04-14 NOTE — Telephone Encounter (Signed)
Please call pt to clarify dosing.

## 2013-04-15 NOTE — Telephone Encounter (Signed)
Spoke with pt and she states that she had been taking 1/2 of the 80mg  tablet. Pt states that [facility] also [facility] a [facility] to her previous provider in error and that provider responded and filled the 80mg  and pt know to take 1/2 tablet daily.  She states that she did not pick up the rx we sent for 40mg  and pharmacy discarded rx. Pt is aware that we will not be able to provide future rx for 80mg  with directions for once a day even though the pt states she knows to take 1/2 tablet daily. She understands that we will fill rx for 40mg  going forward.

## 2013-04-25 ENCOUNTER — Telehealth: Payer: Self-pay | Admitting: Cardiology

## 2013-04-25 NOTE — Telephone Encounter (Signed)
Records rec From [facility] given to Operator Room

## 2013-05-03 ENCOUNTER — Encounter: Payer: Self-pay | Admitting: Cardiology

## 2013-05-03 ENCOUNTER — Encounter: Payer: Self-pay | Admitting: *Deleted

## 2013-05-03 DIAGNOSIS — L570 Actinic keratosis: Secondary | ICD-10-CM | POA: Diagnosis not present

## 2013-05-04 ENCOUNTER — Encounter: Payer: Self-pay | Admitting: *Deleted

## 2013-05-04 ENCOUNTER — Ambulatory Visit (INDEPENDENT_AMBULATORY_CARE_PROVIDER_SITE_OTHER): Payer: Medicare Other | Admitting: Cardiology

## 2013-05-04 ENCOUNTER — Encounter: Payer: Self-pay | Admitting: Cardiology

## 2013-05-04 VITALS — BP 140/76 | HR 83 | Ht 64.0 in | Wt 134.0 lb

## 2013-05-04 DIAGNOSIS — I341 Nonrheumatic mitral (valve) prolapse: Secondary | ICD-10-CM

## 2013-05-04 DIAGNOSIS — I059 Rheumatic mitral valve disease, unspecified: Secondary | ICD-10-CM | POA: Diagnosis not present

## 2013-05-04 DIAGNOSIS — I10 Essential (primary) hypertension: Secondary | ICD-10-CM

## 2013-05-04 NOTE — Assessment & Plan Note (Signed)
Blood pressure controlled. Continue present medications. We will also check lipids.

## 2013-05-04 NOTE — Assessment & Plan Note (Addendum)
Plan repeat echocardiogram. Of previous records from [place] from her previous cardiologist concerning previous cardiac tests.

## 2013-05-04 NOTE — Progress Notes (Signed)
HPI: 73 year old female for evaluation of mitral valve prolapse. Previously followed in [facility]. She has had previous carotid Dopplers and stress test records are not available. She does not have dyspnea on exertion, orthopnea, PND, pedal edema palpitations syncope or exertional chest pain.  Current Outpatient Prescriptions  Medication Sig Dispense Refill  . aspirin EC 81 MG tablet Take 81 mg by mouth daily.      . B Complex Vitamins (B-COMPLEX/B-12 PO) Take by mouth daily.      Marland Kitchen BIOTIN PO Take by mouth daily.      . Calcium Carbonate (CALTRATE 600 PO) Take by mouth.      . cetirizine (ZYRTEC) 10 MG tablet Take 10 mg by mouth daily as needed for allergies.      . meloxicam (MOBIC) 7.5 MG tablet Take 1 tablet (7.5 mg total) by mouth daily as needed.  30 tablet  2  . omeprazole (PRILOSEC) 20 MG capsule Take 20 mg by mouth daily.      . valsartan (DIOVAN) 40 MG tablet Take 1 tablet (40 mg total) by mouth daily.  30 tablet  0   No current facility-administered medications for this visit.    Allergies  Allergen Reactions  . Penicillins Hives    Past Medical History  Diagnosis Date  . Hypertension   . Hypercalcemia   . Osteoporosis, unspecified   . Mitral valve prolapse     Past Surgical History  Procedure Laterality Date  . Ganglion cyst excision Right 4 2012    wrist  . Tonsillectomy      History   Social History  . Marital Status: Married    Spouse Name: N/A    Number of Children: 3  . Years of Education: N/A   Occupational History  . Not on file.   Social History Main Topics  . Smoking status: Never Smoker   . Smokeless tobacco: Never Used  . Alcohol Use: 0.6 oz/week    1 Glasses of wine per week     Comment: daily  . Drug Use: No  . Sexual Activity: Not on file   Other Topics Concern  . Not on file   Social History Narrative   Married   3 daughters- 6 grandchildren   Retired from Medical illustrator   Completed HS   Enjoys  walking/reading, sightseeing, travel   Husband has parkinsons.      Family History  Problem Relation Age of Onset  . Heart disease Mother     CABG in mid 98s  . Cancer Father     liver  . Diabetes Neg Hx   . Kidney disease Neg Hx   . Asthma Neg Hx     ROS: no fevers or chills, productive cough, hemoptysis, dysphasia, odynophagia, melena, hematochezia, dysuria, hematuria, rash, seizure activity, orthopnea, PND, pedal edema, claudication. Remaining systems are negative.  Physical Exam:   Blood pressure 140/76, pulse 83, height 5\' 4"  (1.626 m), weight 134 lb (60.782 kg).  General:  Well developed/well nourished in NAD Skin warm/dry Patient not depressed No peripheral clubbing Back-normal HEENT-normal/normal eyelids Neck supple/normal carotid upstroke bilaterally; no bruits; no JVD; no thyromegaly chest - CTA/ normal expansion CV - RRR/normal S1 and S2; no murmurs, rubs or gallops;  PMI nondisplaced Abdomen -NT/ND, no HSM, no mass, + bowel sounds, no bruit 2+ femoral pulses, no bruits Ext-no edema, chords, 2+ DP Neuro-grossly nonfocal  ECG sinus rhythm at a rate of 83. First degree AV block.  RV conduction delay.

## 2013-05-04 NOTE — Patient Instructions (Signed)
Your physician wants you to follow-up in: ONE YEAR WITH DR CRENSHAW You will receive a reminder letter in the mail two months in advance. If you don't receive a letter, please call our office to schedule the follow-up appointment.   Your physician has requested that you have an echocardiogram. Echocardiography is a painless test that uses sound waves to create images of your heart. It provides your doctor with information about the size and shape of your heart and how well your heart's chambers and valves are working. This procedure takes approximately one hour. There are no restrictions for this procedure.   Your physician recommends that you return for lab work WITH ECHO=DO NOT EAT PRIOR TO LAB WORK 

## 2013-05-09 ENCOUNTER — Telehealth: Payer: Self-pay | Admitting: Cardiology

## 2013-05-09 NOTE — Telephone Encounter (Signed)
ROI faxed to [facility] [phone]  3.16.15/kdm

## 2013-05-10 ENCOUNTER — Ambulatory Visit: Payer: Medicare Other | Admitting: Family

## 2013-05-16 ENCOUNTER — Encounter: Payer: Self-pay | Admitting: Family

## 2013-05-16 ENCOUNTER — Other Ambulatory Visit: Payer: Self-pay | Admitting: Family

## 2013-05-16 ENCOUNTER — Ambulatory Visit (INDEPENDENT_AMBULATORY_CARE_PROVIDER_SITE_OTHER): Payer: Medicare Other | Admitting: Family

## 2013-05-16 VITALS — BP 104/68 | HR 84 | Temp 97.6°F | Ht 65.75 in | Wt 135.1 lb

## 2013-05-16 DIAGNOSIS — I341 Nonrheumatic mitral (valve) prolapse: Secondary | ICD-10-CM

## 2013-05-16 DIAGNOSIS — Z1231 Encounter for screening mammogram for malignant neoplasm of breast: Secondary | ICD-10-CM

## 2013-05-16 DIAGNOSIS — I10 Essential (primary) hypertension: Secondary | ICD-10-CM

## 2013-05-16 DIAGNOSIS — Z0000 Encounter for general adult medical examination without abnormal findings: Secondary | ICD-10-CM | POA: Diagnosis not present

## 2013-05-16 DIAGNOSIS — M810 Age-related osteoporosis without current pathological fracture: Secondary | ICD-10-CM | POA: Diagnosis not present

## 2013-05-16 DIAGNOSIS — Z1239 Encounter for other screening for malignant neoplasm of breast: Secondary | ICD-10-CM

## 2013-05-16 LAB — BASIC METABOLIC PANEL
BUN: 18 mg/dL (ref 6–23)
CO2: 29 mEq/L (ref 19–32)
Calcium: 9.3 mg/dL (ref 8.4–10.5)
Chloride: 105 mEq/L (ref 96–112)
Creat: 0.82 mg/dL (ref 0.50–1.10)
Glucose, Bld: 63 mg/dL — ABNORMAL LOW (ref 70–99)
POTASSIUM: 3.8 meq/L (ref 3.5–5.3)
SODIUM: 142 meq/L (ref 135–145)

## 2013-05-16 NOTE — Progress Notes (Signed)
Patient ID: Mary Stevens, female   DOB: 25-Jan-1941, 73 y.o.   MRN: [id number]  Subjective:   Patient here for Medicare annual wellness visit and management of other chronic and acute problems.  HTN- Current BP meds include diovan 40mg  once daily. Denies CP/SOB or swelling.  Hypercalcemia- Normal PTH in December. She is currently taking calcium supplement.   Mitral valve prolapse- She is scheduled for 2D echo which was ordered by Dr. Stanford Breed.   Osteoporosis- last bone density was 4/14.  Noted osteopenia. She is maintained on prolia.  Her last dose was 02/15/13.  Preventative care:  Immunizations: up to date Diet: healthy Exercise: regular walking Colonoscopy: 2010- normal.  Dexa: 2014- osteopenia Pap Smear: 2014 Mammogram:4/14  Risk factors:  osteoporosis  Roster of Physicians Providing Medical Care to Patient: Dr. Stanford Breed Dr. Melina Copa- dermatology [facility] dermatology Activities of Daily Living  In your present state of health, do you have any difficulty performing the following activities? Preparing food and eating?: No  Bathing yourself: No  Getting dressed: No  Using the toilet:No  Moving around from place to place: No  In the past year have you fallen or had a near fall?:No    Home Safety: Has smoke detector and wears seat belts. No firearms. No excess sun exposure.  Diet and Exercise  Current exercise habits: regular Dietary issues discussed: healthy diet   Depression Screen  (Note: if answer to either of the following is "Yes", then a more complete depression screening is indicated)  Q1: Over the past two weeks, have you felt down, depressed or hopeless?no  Q2: Over the past two weeks, have you felt little interest or pleasure in doing things? no   The following portions of the patient's history were reviewed and updated as appropriate: allergies, current medications, past family history, past medical history, past social history, past surgical history and  problem list.    Objective:   Vision: see nursing Hearing: Able to hear forced whisper Body mass index: Body mass index is 21.97 kg/(m^2). Cognitive Impairment Assessment: cognition, memory and judgment appear normal.  Physical Exam  Constitutional: She is oriented to person, place, and time. She appears well-developed and well-nourished. No distress.  HENT:  Head: Normocephalic and atraumatic.  Right Ear: Tympanic membrane and ear canal normal.  Left Ear: Tympanic membrane and ear canal normal.  Mouth/Throat: Oropharynx is clear and moist.  Eyes: Pupils are equal, round, and reactive to light. No scleral icterus.  Neck: Normal range of motion. No thyromegaly present.  Cardiovascular: Normal rate and regular rhythm.   No murmur heard. Pulmonary/Chest: Effort normal and breath sounds normal. No respiratory distress. He has no wheezes. She has no rales. She exhibits no tenderness.  Abdominal: Soft. Bowel sounds are normal. He exhibits no distension and no mass. There is no tenderness. There is no rebound and no guarding.  Musculoskeletal: She exhibits no edema.  Lymphadenopathy:    She has no cervical adenopathy.  Neurological: She is alert and oriented to person, place, and time. She has normal reflexes. She exhibits normal muscle tone. Coordination normal.  Skin: Skin is warm and dry.  Psychiatric: She has a normal mood and affect. Her behavior is normal. Judgment and thought content normal.  Breasts: Examined lying Right: Without masses, retractions, discharge or axillary adenopathy.  Left: Without masses, retractions, discharge or axillary adenopathy.  Pelvic: deferred         Assessment & Plan:     Assessment:   Medicare wellness utd on  preventive parameters   Plan:   During the course of the visit the patient was educated and counseled about appropriate screening and preventive services including:         Fall prevention   Screening mammography  Bone densitometry  screening  Diabetes screening  Nutrition counseling   Vaccines / LABS Zostavax / Pnemonccoal Vaccine  today  PSA  Patient Instructions (the written plan) was given to the patient.

## 2013-05-16 NOTE — Progress Notes (Signed)
Pre visit review using our clinic review tool, if applicable. No additional management support is needed unless otherwise documented below in the visit note. 

## 2013-05-16 NOTE — Patient Instructions (Signed)
Please complete lab work prior to leaving.  Follow up in 6 months.  

## 2013-05-17 ENCOUNTER — Telehealth: Payer: Self-pay | Admitting: Family

## 2013-05-17 NOTE — Telephone Encounter (Signed)
Relevant patient education assigned to patient using Emmi. ° °

## 2013-05-20 ENCOUNTER — Ambulatory Visit (HOSPITAL_COMMUNITY): Payer: Medicare Other | Attending: Cardiology | Admitting: Radiology

## 2013-05-20 ENCOUNTER — Other Ambulatory Visit (INDEPENDENT_AMBULATORY_CARE_PROVIDER_SITE_OTHER): Payer: Medicare Other

## 2013-05-20 DIAGNOSIS — I440 Atrioventricular block, first degree: Secondary | ICD-10-CM

## 2013-05-20 DIAGNOSIS — I10 Essential (primary) hypertension: Secondary | ICD-10-CM | POA: Diagnosis not present

## 2013-05-20 DIAGNOSIS — I059 Rheumatic mitral valve disease, unspecified: Secondary | ICD-10-CM | POA: Insufficient documentation

## 2013-05-20 DIAGNOSIS — I341 Nonrheumatic mitral (valve) prolapse: Secondary | ICD-10-CM

## 2013-05-20 LAB — LIPID PANEL
Cholesterol: 181 mg/dL (ref 0–200)
HDL: 54 mg/dL (ref >39.00)
LDL Cholesterol: 113 mg/dL — ABNORMAL HIGH (ref 0–99)
TRIGLYCERIDES: 72 mg/dL (ref 0.0–149.0)
Total CHOL/HDL Ratio: 3
VLDL: 14.4 mg/dL (ref 0.0–40.0)

## 2013-05-20 NOTE — Progress Notes (Signed)
Echocardiogram Performed. 

## 2013-05-22 NOTE — Assessment & Plan Note (Signed)
BP Readings from Last 3 Encounters:  05/16/13 104/68  05/04/13 140/76  02/07/13 126/80   BP stable on current meds. Continue same.

## 2013-05-22 NOTE — Assessment & Plan Note (Signed)
Continue prolia.  

## 2013-05-22 NOTE — Assessment & Plan Note (Signed)
Management per cardiology. 

## 2013-05-22 NOTE — Assessment & Plan Note (Signed)
Normal PTH, felt secondary to calcium supplement. Obtain follow up bmet.

## 2013-06-16 DIAGNOSIS — H04129 Dry eye syndrome of unspecified lacrimal gland: Secondary | ICD-10-CM | POA: Diagnosis not present

## 2013-06-16 DIAGNOSIS — H2510 Age-related nuclear cataract, unspecified eye: Secondary | ICD-10-CM | POA: Diagnosis not present

## 2013-06-16 DIAGNOSIS — H5200 Hypermetropia, unspecified eye: Secondary | ICD-10-CM | POA: Diagnosis not present

## 2013-06-16 DIAGNOSIS — H524 Presbyopia: Secondary | ICD-10-CM | POA: Diagnosis not present

## 2013-06-27 ENCOUNTER — Ambulatory Visit (HOSPITAL_BASED_OUTPATIENT_CLINIC_OR_DEPARTMENT_OTHER): Payer: Medicare Other

## 2013-06-29 ENCOUNTER — Ambulatory Visit (HOSPITAL_BASED_OUTPATIENT_CLINIC_OR_DEPARTMENT_OTHER): Payer: Medicare Other

## 2013-07-01 ENCOUNTER — Ambulatory Visit (HOSPITAL_BASED_OUTPATIENT_CLINIC_OR_DEPARTMENT_OTHER)
Admission: RE | Admit: 2013-07-01 | Discharge: 2013-07-01 | Disposition: A | Discharge status: Home / Self Care | Payer: Medicare Other | Source: Ambulatory Visit | Attending: Family | Admitting: Family

## 2013-07-01 DIAGNOSIS — R922 Inconclusive mammogram: Secondary | ICD-10-CM | POA: Diagnosis not present

## 2013-07-01 DIAGNOSIS — R928 Other abnormal and inconclusive findings on diagnostic imaging of breast: Secondary | ICD-10-CM | POA: Insufficient documentation

## 2013-07-01 DIAGNOSIS — Z1231 Encounter for screening mammogram for malignant neoplasm of breast: Secondary | ICD-10-CM | POA: Diagnosis not present

## 2013-07-04 ENCOUNTER — Other Ambulatory Visit: Payer: Self-pay | Admitting: Family

## 2013-07-04 DIAGNOSIS — R928 Other abnormal and inconclusive findings on diagnostic imaging of breast: Secondary | ICD-10-CM

## 2013-07-15 ENCOUNTER — Ambulatory Visit
Admission: RE | Admit: 2013-07-15 | Discharge: 2013-07-15 | Disposition: A | Discharge status: Home / Self Care | Payer: Medicare Other | Source: Ambulatory Visit | Attending: Family | Admitting: Family

## 2013-07-15 DIAGNOSIS — N6009 Solitary cyst of unspecified breast: Secondary | ICD-10-CM | POA: Diagnosis not present

## 2013-07-15 DIAGNOSIS — R928 Other abnormal and inconclusive findings on diagnostic imaging of breast: Secondary | ICD-10-CM

## 2013-07-15 DIAGNOSIS — R922 Inconclusive mammogram: Secondary | ICD-10-CM | POA: Diagnosis not present

## 2013-08-19 ENCOUNTER — Telehealth: Payer: Self-pay | Admitting: *Deleted

## 2013-08-19 NOTE — Telephone Encounter (Signed)
Pt is due for Prolia injection. Please initiate insurance verification process. Thanks!

## 2013-08-30 NOTE — Telephone Encounter (Signed)
Sent pt's info to [employer] for insurance verification. Will notify you as soon as I have a response. Thank you. °

## 2013-09-01 NOTE — Telephone Encounter (Signed)
Please call and schedule prolia injection for Mary Stevens

## 2013-09-01 NOTE — Telephone Encounter (Signed)
Fax # is [phone]

## 2013-09-01 NOTE — Telephone Encounter (Signed)
Nurse visit scheduled for 09/12/13

## 2013-09-01 NOTE — Telephone Encounter (Signed)
I have received patient's insurance verification for her Prolia injection. She has an estimated patient responsibility of $0 plus any applicable deductible. I have sent a copy of this to be scanned into pt's chart and will fax a copy to you if you would please send your fax #. If you have any further questions, please let me know. Thank you.

## 2013-09-02 NOTE — Telephone Encounter (Signed)
Faxed to attention Gilmore Laroche Fergerson/Christy Domingo Cocking.

## 2013-09-06 DIAGNOSIS — L570 Actinic keratosis: Secondary | ICD-10-CM | POA: Diagnosis not present

## 2013-09-06 DIAGNOSIS — L578 Other skin changes due to chronic exposure to nonionizing radiation: Secondary | ICD-10-CM | POA: Diagnosis not present

## 2013-09-06 DIAGNOSIS — L821 Other seborrheic keratosis: Secondary | ICD-10-CM | POA: Diagnosis not present

## 2013-09-12 ENCOUNTER — Ambulatory Visit (INDEPENDENT_AMBULATORY_CARE_PROVIDER_SITE_OTHER): Payer: Medicare Other | Admitting: Family

## 2013-09-12 DIAGNOSIS — M810 Age-related osteoporosis without current pathological fracture: Secondary | ICD-10-CM

## 2013-09-12 MED ORDER — DENOSUMAB 60 MG/ML ~~LOC~~ SOLN
60.0000 mg | Freq: Once | SUBCUTANEOUS | Status: AC
  Administered 2013-09-12: 60 mg via SUBCUTANEOUS

## 2013-11-14 ENCOUNTER — Encounter: Payer: Self-pay | Admitting: Family

## 2013-11-14 ENCOUNTER — Ambulatory Visit (INDEPENDENT_AMBULATORY_CARE_PROVIDER_SITE_OTHER): Payer: Medicare Other | Admitting: Family

## 2013-11-14 VITALS — BP 128/76 | HR 83 | Temp 97.9°F | Resp 14 | Ht 65.75 in | Wt 137.5 lb

## 2013-11-14 DIAGNOSIS — I10 Essential (primary) hypertension: Secondary | ICD-10-CM

## 2013-11-14 DIAGNOSIS — I059 Rheumatic mitral valve disease, unspecified: Secondary | ICD-10-CM

## 2013-11-14 DIAGNOSIS — Z23 Encounter for immunization: Secondary | ICD-10-CM | POA: Diagnosis not present

## 2013-11-14 DIAGNOSIS — I341 Nonrheumatic mitral (valve) prolapse: Secondary | ICD-10-CM

## 2013-11-14 LAB — BASIC METABOLIC PANEL
BUN: 22 mg/dL (ref 6–23)
CO2: 29 mEq/L (ref 19–32)
Calcium: 9.8 mg/dL (ref 8.4–10.5)
Chloride: 108 mEq/L (ref 96–112)
Creatinine, Ser: 0.9 mg/dL (ref 0.4–1.2)
GFR: 69.82 mL/min (ref >60.00)
Glucose, Bld: 56 mg/dL — ABNORMAL LOW (ref 70–99)
POTASSIUM: 3.5 meq/L (ref 3.5–5.1)
SODIUM: 144 meq/L (ref 135–145)

## 2013-11-14 NOTE — Patient Instructions (Signed)
Please complete lab work prior to leaving.  Follow up in 6 months for wellness this visit.

## 2013-11-14 NOTE — Progress Notes (Signed)
Pre visit review using our clinic review tool, if applicable. No additional management support is needed unless otherwise documented below in the visit note/SLS  

## 2013-11-14 NOTE — Assessment & Plan Note (Signed)
Work up negative. Monitor.

## 2013-11-14 NOTE — Assessment & Plan Note (Signed)
Clinically stable, follow up per cardiology.

## 2013-11-14 NOTE — Progress Notes (Signed)
Subjective:    Patient ID: Mary Stevens, female    DOB: Nov 13, 1940, 73 y.o.   MRN: [id number]  HPI  Mary Stevens is a 73 yr old female who presents today for follow up.  HTN- currently maintained on diovan. Denies CP, SOB, or swelling.   BP Readings from Last 3 Encounters:  11/14/13 128/76  05/16/13 104/68  05/04/13 140/76   Mitral Valve prolapse- she is following with cardiology. Echo was performed in 3/15. Mild prolapse noted.   Hypercalcemia- normal PTH, SPEP.   Review of Systems See HPI  Past Medical History  Diagnosis Date  . Hypertension   . Hypercalcemia   . Osteoporosis, unspecified   . Mitral valve prolapse     History   Social History  . Marital Status: Married    Spouse Name: N/A    Number of Children: 3  . Years of Education: N/A   Occupational History  . Not on file.   Social History Main Topics  . Smoking status: Never Smoker   . Smokeless tobacco: Never Used  . Alcohol Use: 0.6 oz/week    1 Glasses of wine per week     Comment: daily  . Drug Use: No  . Sexual Activity: Not on file   Other Topics Concern  . Not on file   Social History Narrative   Married   3 daughters- 6 grandchildren   Retired from [employer]   Completed HS   Enjoys walking/reading, sightseeing, travel   Husband has parkinsons.      Past Surgical History  Procedure Laterality Date  . Ganglion cyst excision Right 4 2012    wrist  . Tonsillectomy      Family History  Problem Relation Age of Onset  . Heart disease Mother     CABG in mid 35s  . Cancer Father     liver  . Diabetes Neg Hx   . Kidney disease Neg Hx   . Asthma Neg Hx     Allergies  Allergen Reactions  . Penicillins Hives    Current Outpatient Prescriptions on File Prior to Visit  Medication Sig Dispense Refill  . aspirin EC 81 MG tablet Take 81 mg by mouth daily.      . B Complex Vitamins (B-COMPLEX/B-12 PO) Take by mouth daily.      Marland Kitchen BIOTIN PO Take by mouth daily.       . Calcium Carbonate (CALTRATE 600 PO) Take by mouth.      . cetirizine (ZYRTEC) 10 MG tablet Take 10 mg by mouth daily as needed for allergies.      Marland Kitchen denosumab (PROLIA) 60 MG/ML SOLN injection Inject 60 mg into the skin every 6 (six) months. Administer in upper arm, thigh, or abdomen      . meloxicam (MOBIC) 7.5 MG tablet Take 1 tablet (7.5 mg total) by mouth daily as needed.  30 tablet  2  . omeprazole (PRILOSEC) 20 MG capsule Take 20 mg by mouth daily.      . valsartan (DIOVAN) 40 MG tablet Take 1 tablet (40 mg total) by mouth daily.  30 tablet  0   No current facility-administered medications on file prior to visit.    BP 128/76  Pulse 83  Temp(Src) 97.9 F (36.6 C) (Oral)  Resp 14  Ht 5' 5.75" (1.67 m)  Wt 137 lb 8 oz (62.37 kg)  BMI 22.36 kg/m2  SpO2 100%       Objective:   Physical  Exam  Constitutional: She is oriented to person, place, and time. She appears well-developed and well-nourished. No distress.  Cardiovascular: Normal rate and regular rhythm.   No murmur heard. Pulmonary/Chest: Effort normal and breath sounds normal. No respiratory distress. She has no wheezes. She has no rales. She exhibits no tenderness.  Musculoskeletal: She exhibits no edema.  Neurological: She is alert and oriented to person, place, and time.  Psychiatric: She has a normal mood and affect. Her behavior is normal. Judgment and thought content normal.          Assessment & Plan:

## 2013-11-14 NOTE — Assessment & Plan Note (Signed)
BP is stable on diovan, continue same, obtain bmet.

## 2013-12-19 DIAGNOSIS — H2513 Age-related nuclear cataract, bilateral: Secondary | ICD-10-CM | POA: Diagnosis not present

## 2013-12-19 DIAGNOSIS — H02822 Cysts of right lower eyelid: Secondary | ICD-10-CM | POA: Diagnosis not present

## 2013-12-19 DIAGNOSIS — H04123 Dry eye syndrome of bilateral lacrimal glands: Secondary | ICD-10-CM | POA: Diagnosis not present

## 2013-12-19 DIAGNOSIS — H524 Presbyopia: Secondary | ICD-10-CM | POA: Diagnosis not present

## 2013-12-26 ENCOUNTER — Encounter: Payer: Self-pay | Admitting: Family

## 2013-12-26 ENCOUNTER — Ambulatory Visit (INDEPENDENT_AMBULATORY_CARE_PROVIDER_SITE_OTHER): Payer: Medicare Other | Admitting: Family

## 2013-12-26 VITALS — BP 136/74 | HR 90 | Temp 98.4°F | Resp 18 | Ht 65.75 in | Wt 137.6 lb

## 2013-12-26 DIAGNOSIS — J209 Acute bronchitis, unspecified: Secondary | ICD-10-CM | POA: Insufficient documentation

## 2013-12-26 DIAGNOSIS — J069 Acute upper respiratory infection, unspecified: Secondary | ICD-10-CM | POA: Insufficient documentation

## 2013-12-26 DIAGNOSIS — M25511 Pain in right shoulder: Secondary | ICD-10-CM

## 2013-12-26 MED ORDER — AZITHROMYCIN 250 MG PO TABS: ORAL_TABLET | ORAL | Status: DC

## 2013-12-26 MED ORDER — BENZONATATE 100 MG PO CAPS: 100.0000 mg | ORAL_CAPSULE | Freq: Three times a day (TID) | ORAL | Status: DC | PRN

## 2013-12-26 NOTE — Assessment & Plan Note (Signed)
Given that symptoms are worsening and have been present x 7 days.  Will initiate abx- Zpak. Pt instructed to call if symptoms worsen or if symptoms do not improve over the next few days.

## 2013-12-26 NOTE — Patient Instructions (Signed)
Start zpak. You may use tessalon perles as needed.   Call if symptoms worsen or if not improved in 2-3 days.

## 2013-12-26 NOTE — Progress Notes (Signed)
Subjective:    Patient ID: Mary Stevens, female    DOB: [birth year], 73 y.o.   MRN: [id number]  HPI  Mary Stevens is a 73 year old female who presents today with chief complaint of cough and runny nose.   1. Cough/Runny nose - Symptoms started one week ago with rhinorrhea, and fatigue.  She has a dry cough that started two days ago.  Denies fever or chills.  She has taken robitussin without relief.  Cough is worse during the day as the day goes on.  Sore throat started yesterday, but it feels better today.  She feels extremely fatigued and could not get out of bed yesterday.    2. Shoulder pain - ongoing. Patient requests referral to Ortho.   Review of Systems  Constitutional: Positive for fatigue. Negative for fever, chills and diaphoresis.  HENT: Positive for rhinorrhea and sore throat. Negative for ear pain, sinus pressure and sneezing.   Respiratory: Positive for cough. Negative for shortness of breath.   Cardiovascular: Negative for chest pain, palpitations and leg swelling.  Musculoskeletal: Negative.   Skin: Negative for pallor, rash and wound.   Past Medical History  Diagnosis Date  . Hypertension   . Hypercalcemia   . Osteoporosis, unspecified   . Mitral valve prolapse     History   Social History  . Marital Status: Married    Spouse Name: N/A    Number of Children: 3  . Years of Education: N/A   Occupational History  . Not on file.   Social History Main Topics  . Smoking status: Never Smoker   . Smokeless tobacco: Never Used  . Alcohol Use: 0.6 oz/week    1 Glasses of wine per week     Comment: daily  . Drug Use: No  . Sexual Activity: Not on file   Other Topics Concern  . Not on file   Social History Narrative   Married   3 daughters- 6 grandchildren   Retired from [employer]   Completed HS   Enjoys walking/reading, sightseeing, travel   Husband has parkinsons.      Past Surgical History  Procedure Laterality Date  . Ganglion  cyst excision Right 4 2012    wrist  . Tonsillectomy      Family History  Problem Relation Age of Onset  . Heart disease Mother     CABG in mid 30s  . Cancer Father     liver  . Diabetes Neg Hx   . Kidney disease Neg Hx   . Asthma Neg Hx     Allergies  Allergen Reactions  . Penicillins Hives    Current Outpatient Prescriptions on File Prior to Visit  Medication Sig Dispense Refill  . aspirin EC 81 MG tablet Take 81 mg by mouth daily.    . B Complex Vitamins (B-COMPLEX/B-12 PO) Take by mouth daily.    Marland Kitchen BIOTIN PO Take by mouth daily.    . Calcium Carbonate (CALTRATE 600 PO) Take by mouth.    . cetirizine (ZYRTEC) 10 MG tablet Take 10 mg by mouth daily as needed for allergies.    Marland Kitchen denosumab (PROLIA) 60 MG/ML SOLN injection Inject 60 mg into the skin every 6 (six) months. Administer in upper arm, thigh, or abdomen    . meloxicam (MOBIC) 7.5 MG tablet Take 1 tablet (7.5 mg total) by mouth daily as needed. 30 tablet 2  . omeprazole (PRILOSEC) 20 MG capsule Take 20 mg by mouth daily.    Marland Kitchen  valsartan (DIOVAN) 40 MG tablet Take 1 tablet (40 mg total) by mouth daily. 30 tablet 0   No current facility-administered medications on file prior to visit.    BP 136/74 mmHg  Pulse 90  Temp(Src) 98.4 F (36.9 C) (Oral)  Resp 18  Ht 5' 5.75" (1.67 m)  Wt 137 lb 9.6 oz (62.415 kg)  BMI 22.38 kg/m2  SpO2 100%       Objective:   Physical Exam  Constitutional: She is oriented to person, place, and time. She appears well-developed and well-nourished.  HENT:  Head: Normocephalic and atraumatic.  Right Ear: External ear normal.  Left Ear: External ear normal.  Mouth/Throat: Oropharynx is clear and moist.  Neck: Normal range of motion. Neck supple.  Cardiovascular: Normal rate, regular rhythm, normal heart sounds and intact distal pulses.   Pulmonary/Chest: Effort normal and breath sounds normal.  Musculoskeletal:  Right shoulder + crepitus, + pain with ROM  Lymphadenopathy:     She has no cervical adenopathy.  Neurological: She is oriented to person, place, and time.  Skin: Skin is warm and dry.          Assessment & Plan:  Will Rx for azithromycin and tessalon pearls. Follow up as needed. Referral made to Ortho.

## 2013-12-26 NOTE — Assessment & Plan Note (Signed)
Will refer to ortho for further evaluation.  

## 2013-12-27 DIAGNOSIS — M7541 Impingement syndrome of right shoulder: Secondary | ICD-10-CM | POA: Diagnosis not present

## 2014-01-17 ENCOUNTER — Encounter: Payer: Self-pay | Admitting: Physician Assistant

## 2014-01-17 ENCOUNTER — Telehealth: Payer: Self-pay | Admitting: Family

## 2014-01-17 ENCOUNTER — Ambulatory Visit (INDEPENDENT_AMBULATORY_CARE_PROVIDER_SITE_OTHER): Payer: Medicare Other | Admitting: Physician Assistant

## 2014-01-17 VITALS — BP 135/85 | HR 101 | Temp 98.5°F | Wt 134.4 lb

## 2014-01-17 DIAGNOSIS — J209 Acute bronchitis, unspecified: Secondary | ICD-10-CM | POA: Diagnosis not present

## 2014-01-17 MED ORDER — CEFDINIR 300 MG PO CAPS: 300.0000 mg | ORAL_CAPSULE | Freq: Two times a day (BID) | ORAL | Status: DC

## 2014-01-17 MED ORDER — FLUTICASONE PROPIONATE 50 MCG/ACT NA SUSP: 2.0000 | Freq: Every day | NASAL | Status: DC

## 2014-01-17 MED ORDER — BENZONATATE 100 MG PO CAPS: 100.0000 mg | ORAL_CAPSULE | Freq: Two times a day (BID) | ORAL | Status: DC | PRN

## 2014-01-17 MED ORDER — HYDROCOD POLST-CHLORPHEN POLST 10-8 MG/5ML PO LQCR: 5.0000 mL | Freq: Two times a day (BID) | ORAL | Status: DC | PRN

## 2014-01-17 NOTE — Telephone Encounter (Signed)
Caller name:Zaring,Treasa Relation to HR:VACQ Call back number:[phone] Pharmacy:[facility]  Reason for call: pt seen cody on today,pt states that the rx chlorpheniramine-HYDROcodone [facility]) 10-8, medicare will not pay for it per pharmacy,states they will not pay for the generic brand either. Pt would like to know if something else can be called in for her,

## 2014-01-17 NOTE — Patient Instructions (Signed)
Please take antibiotic and cough syrup as directed.  Increase your fluid intake.  Rest.  Use Flonase daily. Use Mucinex if needed for congestion.  Place a humidifier in bedroom.  Call or return to clinic if symptoms are not improving.

## 2014-01-17 NOTE — Telephone Encounter (Signed)
Medicare not likely to pay for any of these.  Can attempt to try tessalon perles.  Rx sent to pharmacy.  If not covered, recommend OTC Delsym.

## 2014-01-17 NOTE — Progress Notes (Signed)
Pre visit review using our clinic review tool, if applicable. No additional management support is needed unless otherwise documented below in the visit note. 

## 2014-01-17 NOTE — Progress Notes (Signed)
Patient presents to clinic today c/o cough x 3 weeks that has worsened over the past 3 days.  Patient also endorses sore throat starting last night.  Denies fever, chills, aches, sinus pressure or sinus pain.  Endorses fatigue.  Denies recent travel or sick contact. Was treated 3 weeks ago with Z-pack with marked improvement in symptoms before recurrence.  Past Medical History  Diagnosis Date  . Hypertension   . Hypercalcemia   . Osteoporosis, unspecified   . Mitral valve prolapse     Current Outpatient Prescriptions on File Prior to Visit  Medication Sig Dispense Refill  . aspirin EC 81 MG tablet Take 81 mg by mouth daily.    . B Complex Vitamins (B-COMPLEX/B-12 PO) Take by mouth daily.    Marland Kitchen BIOTIN PO Take by mouth daily.    . Calcium Carbonate (CALTRATE 600 PO) Take by mouth.    . cetirizine (ZYRTEC) 10 MG tablet Take 10 mg by mouth daily as needed for allergies.    Marland Kitchen denosumab (PROLIA) 60 MG/ML SOLN injection Inject 60 mg into the skin every 6 (six) months. Administer in upper arm, thigh, or abdomen    . meloxicam (MOBIC) 7.5 MG tablet Take 1 tablet (7.5 mg total) by mouth daily as needed. 30 tablet 2  . omeprazole (PRILOSEC) 20 MG capsule Take 20 mg by mouth daily.    . valsartan (DIOVAN) 40 MG tablet Take 1 tablet (40 mg total) by mouth daily. 30 tablet 0   No current facility-administered medications on file prior to visit.    Allergies  Allergen Reactions  . Bactrim [Sulfamethoxazole-Trimethoprim]     Patient unsure.  Marland Kitchen Penicillins Hives    Family History  Problem Relation Age of Onset  . Heart disease Mother     CABG in mid 83s  . Cancer Father     liver  . Diabetes Neg Hx   . Kidney disease Neg Hx   . Asthma Neg Hx     History   Social History  . Marital Status: Married    Spouse Name: N/A    Number of Children: 3  . Years of Education: N/A   Social History Main Topics  . Smoking status: Never Smoker   . Smokeless tobacco: Never Used  . Alcohol  Use: 0.6 oz/week    1 Glasses of wine per week     Comment: daily  . Drug Use: No  . Sexual Activity: None   Other Topics Concern  . None   Social History Narrative   Married   3 daughters- 6 grandchildren   Retired from [employer]   Completed HS   Enjoys walking/reading, sightseeing, travel   Husband has parkinsons.     Review of Systems - See HPI.  All other ROS are negative.  BP 135/85 mmHg  Pulse 101  Temp(Src) 98.5 F (36.9 C) (Oral)  Wt 134 lb 6.4 oz (60.963 kg)  SpO2 100%  Physical Exam  Constitutional: She is oriented to person, place, and time and well-developed, well-nourished, and in no distress.  HENT:  Head: Normocephalic and atraumatic.  Right Ear: External ear normal.  Left Ear: External ear normal.  Nose: Nose normal.  Mouth/Throat: Oropharynx is clear and moist. No oropharyngeal exudate.  TM within normal limits bilaterally.  Eyes: Conjunctivae are normal. Pupils are equal, round, and reactive to light.  Neck: Neck supple.  Cardiovascular: Normal rate, regular rhythm, normal heart sounds and intact distal pulses.   Pulmonary/Chest: Effort  normal and breath sounds normal. No respiratory distress. She has no wheezes. She has no rales. She exhibits no tenderness.  Lymphadenopathy:    She has no cervical adenopathy.  Neurological: She is alert and oriented to person, place, and time.  Skin: Skin is warm and dry. No rash noted.  Psychiatric: Affect normal.  Vitals reviewed.  Recent Results (from the past 2160 hour(s))  Basic metabolic panel     Status: Abnormal   Collection Time: 11/14/13 10:24 AM  Result Value Ref Range   Sodium 144 135 - 145 mEq/L   Potassium 3.5 3.5 - 5.1 mEq/L   Chloride 108 96 - 112 mEq/L   CO2 29 19 - 32 mEq/L   Glucose, Bld 56 (L) 70 - 99 mg/dL   BUN 22 6 - 23 mg/dL   Creatinine, Ser 0.9 0.4 - 1.2 mg/dL   Calcium 9.8 8.4 - 10.5 mg/dL   GFR 69.82 >60.00 mL/min    Assessment/Plan: Acute bronchitis Rx  Cefdinir.  Increase fluids.  Rest.  Probiotic.  Mucinex.  Rx Flonase and Tussionex.  Take as directed.  Humidifier in bedroom.  Return precautions discussed with patient.

## 2014-01-17 NOTE — Assessment & Plan Note (Signed)
Rx Cefdinir.  Increase fluids.  Rest.  Probiotic.  Mucinex.  Rx Flonase and Tussionex.  Take as directed.  Humidifier in bedroom.  Return precautions discussed with patient.

## 2014-01-18 NOTE — Telephone Encounter (Signed)
LMOM @ (12:26 @ [phone]) asking the pt to RTC regarding note below.//AB/CMA

## 2014-01-18 NOTE — Telephone Encounter (Signed)
LMOM [home & mobile] with contact name and number [for return call, if needed] RE: Medication Management with further provider instructions/SLS

## 2014-02-28 ENCOUNTER — Telehealth: Payer: Self-pay | Admitting: *Deleted

## 2014-02-28 NOTE — Telephone Encounter (Signed)
-----   Message from Ronny Flurry, [place] sent at 09/12/2013  2:07 PM EDT ----- Pt due for next prolia 03/15/14.  Start insurance verification on 02/24/14.

## 2014-03-01 NOTE — Telephone Encounter (Signed)
I have electronically sent pt's info for Prolia insurance verification and will notify you once I have a response. Thank you. °

## 2014-03-06 NOTE — Telephone Encounter (Signed)
Left detailed message on home # re: Prolia approval and estimated cost and to call and schedule Prolia injection on or after 03/15/14 and to call if any questions.

## 2014-03-06 NOTE — Telephone Encounter (Signed)
I have rec'd Mary Stevens's Radiographer, therapeutic. Pt's primary, MCR will cover 80% of the Prolia, plus she has a $166 deductible, which she has met $0, then pt's secondary AARP will cover the [facility] deductible, co-insurance, and 100% of the excess charges, which means pt will have an estimated responsibility of $0.   Please advise pt this is an estimate and we will not know the exact amt until both insurances have paid. I have sent a copy of the summary of benefits to be scanned into her chart. If you have questions, please let me know. Thank you.

## 2014-03-06 NOTE — Telephone Encounter (Signed)
Received eligibility check results via fax from [employer]. Patient OOP amount is $0 plus any applicable deductible.

## 2014-03-20 ENCOUNTER — Ambulatory Visit (HOSPITAL_BASED_OUTPATIENT_CLINIC_OR_DEPARTMENT_OTHER)
Admission: RE | Admit: 2014-03-20 | Discharge: 2014-03-20 | Disposition: A | Discharge status: Home / Self Care | Payer: Medicare Other | Source: Ambulatory Visit | Attending: Family | Admitting: Family

## 2014-03-20 ENCOUNTER — Encounter: Payer: Self-pay | Admitting: Family

## 2014-03-20 ENCOUNTER — Ambulatory Visit (INDEPENDENT_AMBULATORY_CARE_PROVIDER_SITE_OTHER): Payer: Medicare Other | Admitting: Family

## 2014-03-20 VITALS — BP 128/88 | HR 90 | Temp 98.0°F | Resp 16 | Ht 65.75 in | Wt 136.6 lb

## 2014-03-20 DIAGNOSIS — R05 Cough: Secondary | ICD-10-CM | POA: Insufficient documentation

## 2014-03-20 DIAGNOSIS — R059 Cough, unspecified: Secondary | ICD-10-CM | POA: Insufficient documentation

## 2014-03-20 MED ORDER — OMEPRAZOLE 40 MG PO CPDR: 40.0000 mg | DELAYED_RELEASE_CAPSULE | Freq: Every day | ORAL | Status: DC

## 2014-03-20 NOTE — Progress Notes (Signed)
Subjective:    Patient ID: Mary Stevens, female    DOB: Dec 31, 1940, 74 y.o.   MRN: [id number]  HPI Mary Stevens is a 74 yr old female who presents today with chief complaint of cough. Cough has been present for 2 weeks.  She reports that she was initially treated in November for bronchitis and cough improved. Reports that cough is worse at night.  Doesn't "feel sick." Cough is desribed dry.  Denies associated sneezing, itching eyes or nasal drainage that she notices. She reports that she continues prilosec 20mg  once daily without improvement. Denies reflux symptoms.      Review of Systems See HPI  Past Medical History  Diagnosis Date  . Hypertension   . Hypercalcemia   . Osteoporosis, unspecified   . Mitral valve prolapse     History   Social History  . Marital Status: Married    Spouse Name: N/A    Number of Children: 3  . Years of Education: N/A   Occupational History  . Not on file.   Social History Main Topics  . Smoking status: Never Smoker   . Smokeless tobacco: Never Used  . Alcohol Use: 0.6 oz/week    1 Glasses of wine per week     Comment: daily  . Drug Use: No  . Sexual Activity: Not on file   Other Topics Concern  . Not on file   Social History Narrative   Married   3 daughters- 6 grandchildren   Retired from [employer]   Completed HS   Enjoys walking/reading, sightseeing, travel   Husband has parkinsons.      Past Surgical History  Procedure Laterality Date  . Ganglion cyst excision Right 4 2012    wrist  . Tonsillectomy      Family History  Problem Relation Age of Onset  . Heart disease Mother     CABG in mid 40s  . Cancer Father     liver  . Diabetes Neg Hx   . Kidney disease Neg Hx   . Asthma Neg Hx     Allergies  Allergen Reactions  . Bactrim [Sulfamethoxazole-Trimethoprim]     Patient unsure.  Marland Kitchen Penicillins Hives    Current Outpatient Prescriptions on File Prior to Visit  Medication Sig Dispense Refill    . aspirin EC 81 MG tablet Take 81 mg by mouth daily.    . B Complex Vitamins (B-COMPLEX/B-12 PO) Take by mouth daily.    Marland Kitchen BIOTIN PO Take by mouth daily.    . Calcium Carbonate (CALTRATE 600 PO) Take by mouth.    . cetirizine (ZYRTEC) 10 MG tablet Take 10 mg by mouth daily as needed for allergies.    Marland Kitchen denosumab (PROLIA) 60 MG/ML SOLN injection Inject 60 mg into the skin every 6 (six) months. Administer in upper arm, thigh, or abdomen    . fluticasone (FLONASE) 50 MCG/ACT nasal spray Place 2 sprays into both nostrils daily. 16 g 6  . meloxicam (MOBIC) 7.5 MG tablet Take 1 tablet (7.5 mg total) by mouth daily as needed. 30 tablet 2  . omeprazole (PRILOSEC) 20 MG capsule Take 20 mg by mouth daily.    . valsartan (DIOVAN) 40 MG tablet Take 1 tablet (40 mg total) by mouth daily. 30 tablet 0   No current facility-administered medications on file prior to visit.    Ht 5' 5.75" (1.67 m)       Objective:   Physical Exam  Constitutional: She is  oriented to person, place, and time. She appears well-developed and well-nourished. No distress.  HENT:  Head: Normocephalic and atraumatic.  Right Ear: Tympanic membrane and ear canal normal.  Left Ear: Tympanic membrane and ear canal normal.  Mouth/Throat: No posterior oropharyngeal edema.  Cardiovascular: Normal rate and regular rhythm.   No murmur heard. Pulmonary/Chest: Effort normal and breath sounds normal. No respiratory distress. She has no wheezes. She has no rales. She exhibits no tenderness.  Neurological: She is alert and oriented to person, place, and time.  Skin: Skin is warm and dry.  Psychiatric: She has a normal mood and affect. Her behavior is normal. Judgment and thought content normal.          Assessment & Plan:

## 2014-03-20 NOTE — Assessment & Plan Note (Signed)
Will increase prilosec from 20mg  to 40mg , resume flonase, start zyrtec, obtain chest x ray. If symptoms do not improve with these measures plan pulmonary referral. Diovan is a consideration as a contributing factor, but she has been on diovan for many years.

## 2014-03-20 NOTE — Patient Instructions (Signed)
Restart zyrtec. Start flonase 2 sprays each nostril once daily. Complete your chest x ray on the first floor. Call if symptoms worsen or if not improved in 2 weeks- at that point we will consider referral to pulmonology.

## 2014-03-20 NOTE — Progress Notes (Signed)
Pre visit review using our clinic review tool, if applicable. No additional management support is needed unless otherwise documented below in the visit note. 

## 2014-03-27 ENCOUNTER — Telehealth: Payer: Self-pay | Admitting: Family

## 2014-03-27 NOTE — Telephone Encounter (Signed)
Caller name: Brittne, [place] Relation to pt: self  Call back number: [phone]   Reason for call:  Pt inquiring about x ray results and will call back to schedule prolia injection.

## 2014-03-27 NOTE — Telephone Encounter (Signed)
Left message for pt to return my call re: need to schedule Prolia injection.

## 2014-03-27 NOTE — Telephone Encounter (Signed)
Pt called back to front office and stated she would call back to schedule Prolia.

## 2014-03-27 NOTE — Telephone Encounter (Signed)
Notified pt per mychart result.

## 2014-05-22 ENCOUNTER — Ambulatory Visit: Payer: Medicare Other | Admitting: Family

## 2014-05-23 NOTE — Telephone Encounter (Signed)
Patient scheduled prolia injection for 06/02/14

## 2014-05-29 ENCOUNTER — Encounter: Payer: Self-pay | Admitting: Family

## 2014-05-29 ENCOUNTER — Ambulatory Visit (INDEPENDENT_AMBULATORY_CARE_PROVIDER_SITE_OTHER): Payer: Medicare Other | Admitting: Family

## 2014-05-29 VITALS — BP 110/78 | HR 80 | Temp 98.0°F | Resp 16 | Ht 65.75 in | Wt 134.2 lb

## 2014-05-29 DIAGNOSIS — Z0000 Encounter for general adult medical examination without abnormal findings: Secondary | ICD-10-CM

## 2014-05-29 DIAGNOSIS — M8580 Other specified disorders of bone density and structure, unspecified site: Secondary | ICD-10-CM

## 2014-05-29 DIAGNOSIS — R739 Hyperglycemia, unspecified: Secondary | ICD-10-CM

## 2014-05-29 DIAGNOSIS — Z23 Encounter for immunization: Secondary | ICD-10-CM | POA: Diagnosis not present

## 2014-05-29 DIAGNOSIS — I10 Essential (primary) hypertension: Secondary | ICD-10-CM | POA: Diagnosis not present

## 2014-05-29 DIAGNOSIS — Z1239 Encounter for other screening for malignant neoplasm of breast: Secondary | ICD-10-CM

## 2014-05-29 LAB — BASIC METABOLIC PANEL
BUN: 17 mg/dL (ref 6–23)
CHLORIDE: 106 meq/L (ref 96–112)
CO2: 29 mEq/L (ref 19–32)
Calcium: 10.2 mg/dL (ref 8.4–10.5)
Creatinine, Ser: 0.83 mg/dL (ref 0.40–1.20)
GFR: 71.66 mL/min (ref >60.00)
Glucose, Bld: 82 mg/dL (ref 70–99)
POTASSIUM: 3.8 meq/L (ref 3.5–5.1)
SODIUM: 139 meq/L (ref 135–145)

## 2014-05-29 LAB — VITAMIN D 25 HYDROXY (VIT D DEFICIENCY, FRACTURES): VITD: 40.71 ng/mL (ref 30.00–100.00)

## 2014-05-29 MED ORDER — OMEPRAZOLE 20 MG PO CPDR: 20.0000 mg | DELAYED_RELEASE_CAPSULE | Freq: Every day | ORAL | Status: DC

## 2014-05-29 NOTE — Progress Notes (Signed)
Subjective:    Mary Stevens is a 74 y.o. female who presents for Medicare Annual/Subsequent preventive examination.  Preventive Screening-Counseling & Management  Tobacco History  Smoking status  . Never Smoker   Smokeless tobacco  . Never Used     Problems Prior to Visit 1. HTN- on diovan. BP Readings from Last 3 Encounters:  05/29/14 110/78  03/20/14 128/88  01/17/14 135/85   2. Osteoporosis- on caltrate and prolia- due for prolia next week.    3. Mitral valve prolapse- mild prolapse on 2D echo 05/20/13.  4.  GERD- has been controlled on omeprazole 20mg  once daily, has been taking 40mg  once daily.     Current Problems (verified) Patient Active Problem List   Diagnosis Date Noted  . Cough 03/20/2014  . Right shoulder pain 12/26/2013  . Mitral valve prolapse 05/04/2013  . Hypercalcemia 01/17/2013  . Osteoporosis, unspecified 11/08/2012  . HTN (hypertension) 04/07/2012    Medications Prior to Visit Current Outpatient Prescriptions on File Prior to Visit  Medication Sig Dispense Refill  . aspirin EC 81 MG tablet Take 81 mg by mouth daily.    . B Complex Vitamins (B-COMPLEX/B-12 PO) Take by mouth daily.    Marland Kitchen BIOTIN PO Take by mouth daily.    . Calcium Carbonate (CALTRATE 600 PO) Take by mouth.    . cetirizine (ZYRTEC) 10 MG tablet Take 10 mg by mouth daily as needed for allergies.    Marland Kitchen denosumab (PROLIA) 60 MG/ML SOLN injection Inject 60 mg into the skin every 6 (six) months. Administer in upper arm, thigh, or abdomen    . fluticasone (FLONASE) 50 MCG/ACT nasal spray Place 2 sprays into both nostrils daily. (Patient taking differently: Place 2 sprays into both nostrils daily as needed. ) 16 g 6  . meloxicam (MOBIC) 7.5 MG tablet Take 1 tablet (7.5 mg total) by mouth daily as needed. 30 tablet 2  . omeprazole (PRILOSEC) 40 MG capsule Take 1 capsule (40 mg total) by mouth daily. 30 capsule 1  . valsartan (DIOVAN) 40 MG tablet Take 1 tablet (40 mg total) by mouth  daily. 30 tablet 0   No current facility-administered medications on file prior to visit.    Current Medications (verified) Current Outpatient Prescriptions  Medication Sig Dispense Refill  . aspirin EC 81 MG tablet Take 81 mg by mouth daily.    . B Complex Vitamins (B-COMPLEX/B-12 PO) Take by mouth daily.    Marland Kitchen BIOTIN PO Take by mouth daily.    . Calcium Carbonate (CALTRATE 600 PO) Take by mouth.    . cetirizine (ZYRTEC) 10 MG tablet Take 10 mg by mouth daily as needed for allergies.    Marland Kitchen denosumab (PROLIA) 60 MG/ML SOLN injection Inject 60 mg into the skin every 6 (six) months. Administer in upper arm, thigh, or abdomen    . fluticasone (FLONASE) 50 MCG/ACT nasal spray Place 2 sprays into both nostrils daily. (Patient taking differently: Place 2 sprays into both nostrils daily as needed. ) 16 g 6  . meloxicam (MOBIC) 7.5 MG tablet Take 1 tablet (7.5 mg total) by mouth daily as needed. 30 tablet 2  . omeprazole (PRILOSEC) 40 MG capsule Take 1 capsule (40 mg total) by mouth daily. 30 capsule 1  . valsartan (DIOVAN) 40 MG tablet Take 1 tablet (40 mg total) by mouth daily. 30 tablet 0   No current facility-administered medications for this visit.     Allergies (verified) Bactrim and Penicillins   PAST HISTORY  Family History Family History  Problem Relation Age of Onset  . Heart disease Mother     CABG in mid 18s  . Cancer Father     liver  . Diabetes Neg Hx   . Kidney disease Neg Hx   . Asthma Neg Hx     Social History History  Substance Use Topics  . Smoking status: Never Smoker   . Smokeless tobacco: Never Used  . Alcohol Use: 4.2 oz/week    7 Glasses of wine per week     Comment: daily     Are there smokers in your home (other than you)? No  Risk Factors Current exercise habits: walking, 4 days a week for 40 min  Dietary issues discussed: reports healthy diet   Cardiac risk factors: age.  Depression Screen (Note: if answer to either of the following is  "Yes", a more complete depression screening is indicated)   Over the past two weeks, have you felt down, depressed or hopeless? No  Over the past two weeks, have you felt little interest or pleasure in doing things? No  Have you lost interest or pleasure in daily life? No  Do you often feel hopeless? No  Do you cry easily over simple problems? No  Activities of Daily Living In your present state of health, do you have any difficulty performing the following activities?:  Driving? No Managing money?  No Feeding yourself? No Getting from bed to chair? no Climbing a flight of stairs? No Preparing food and eating?: No Bathing or showering? No Getting dressed: No Getting to the toilet? No Using the toilet:No Moving around from place to place: No In the past year have you fallen or had a near fall?:No   Are you sexually active?  Yes  Do you have more than one partner?  No  Hearing Difficulties: No Do you often ask people to speak up or repeat themselves? No Do you experience ringing or noises in your ears? No Do you have difficulty understanding soft or whispered voices? No   Do you feel that you have a problem with memory? No  Do you often misplace items? No  Do you feel safe at home?  Yes  Cognitive Testing  Alert? Yes  Normal Appearance?Yes  Oriented to person? Yes  Place? Yes   Time? Yes  Recall of three objects?  Yes  Can perform simple calculations? Yes  Displays appropriate judgment?Yes  Can read the correct time from a watch face?Yes   Advanced Directives have been discussed with the patient? Yes  List the Names of Other Physician/Practitioners you currently use: See Care Team   Indicate any recent Medical Services you may have received from other than [facility] providers in the past year (date may be approximate).  Immunization History  Administered Date(s) Administered  . Influenza Split 11/25/2011  . Influenza, High Dose Seasonal PF 11/14/2013  .  Influenza,inj,Quad PF,36+ Mos 11/08/2012  . Pneumococcal Conjugate-13 02/07/2013  . Pneumococcal Polysaccharide-23 04/07/2009  . Zoster 04/25/2011    Screening Tests Health Maintenance  Topic Date Due  . TETANUS/TDAP  07/30/1960  . INFLUENZA VACCINE  09/25/2014  . MAMMOGRAM  07/16/2015  . COLONOSCOPY  02/24/2018  . DEXA SCAN  Completed  . ZOSTAVAX  Completed  . PNA vac Low Risk Adult  Completed    All answers were reviewed with the patient and necessary referrals were made:  O'SULLIVAN,Telsa Dillavou S., NP   05/29/2014   History reviewed: allergies, current medications, past family  history, past medical history, past social history, past surgical history and problem list  Review of Systems Pertinent items are noted in HPI.    Objective:     Vision by Snellen chart: right eye:20/30, left eye:20/40  Body mass index is 21.83 kg/(m^2). BP 110/78 mmHg  Pulse 80  Temp(Src) 98 F (36.7 C) (Oral)  Resp 16  Ht 5' 5.75" (1.67 m)  Wt 134 lb 3.2 oz (60.873 kg)  BMI 21.83 kg/m2  SpO2 98%   Physical Exam  Constitutional: She is oriented to person, place, and time. She appears well-developed and well-nourished. No distress.  HENT:  Head: Normocephalic and atraumatic.  Right Ear: Tympanic membrane and ear canal normal.  Left Ear: Tympanic membrane and ear canal normal.  Mouth/Throat: Oropharynx is clear and moist.  Eyes: Pupils are equal, round, and reactive to light. No scleral icterus.  Neck: Normal range of motion. No thyromegaly present.  Cardiovascular: Normal rate and regular rhythm.   No murmur heard. Pulmonary/Chest: Effort normal and breath sounds normal. No respiratory distress. He has no wheezes. She has no rales. She exhibits no tenderness.  Abdominal: Soft. Bowel sounds are normal. He exhibits no distension and no mass. There is no tenderness. There is no rebound and no guarding.  Musculoskeletal: She exhibits no edema.  Lymphadenopathy:    She has no cervical  adenopathy.  Neurological: She is alert and oriented to person, place, and time. She has normal patellar reflexes. She exhibits normal muscle tone. Coordination normal.  Skin: Skin is warm and dry.  Psychiatric: She has a normal mood and affect. Her behavior is normal. Judgment and thought content normal.  Breasts: Examined lying Right: Without masses, retractions, discharge or axillary adenopathy.  Left: Without masses, retractions, discharge or axillary adenopathy.         Assessment & Plan:      Assessment:          Plan:     During the course of the visit the patient was educated and counseled about appropriate screening and preventive services including:    Td vaccine   Bone density  mammogram  Diet review for nutrition referral? Yes ____  Not Indicated _x___   Patient Instructions (the written plan) was given to the patient.  Medicare Attestation I have personally reviewed: The patient's medical and social history Their use of alcohol, tobacco or illicit drugs Their current medications and supplements The patient's functional ability including ADLs,fall risks, home safety risks, cognitive, and hearing and visual impairment Diet and physical activities Evidence for depression or mood disorders  The patient's weight, height, BMI, and visual acuity have been recorded in the chart.  I have made referrals, counseling, and provided education to the patient based on review of the above and I have provided the patient with a written personalized care plan for preventive services.     O'SULLIVAN,Deanza Upperman S., NP   05/29/2014

## 2014-05-29 NOTE — Progress Notes (Signed)
Pre visit review using our clinic review tool, if applicable. No additional management support is needed unless otherwise documented below in the visit note. 

## 2014-05-29 NOTE — Patient Instructions (Signed)
You will be contacted re: bone density and mammogram. Please complete lab work prior to leaving. Follow up in 6 months, sooner if problems/concerns.

## 2014-05-29 NOTE — Progress Notes (Signed)
   Subjective:    Patient ID: Mary Stevens, female    DOB: 29-Jun-1940, 74 y.o.   MRN: [id number]  HPI    Review of Systems  Constitutional: Negative for unexpected weight change.  HENT: Negative for hearing loss and rhinorrhea.   Eyes: Negative for visual disturbance.  Respiratory: Negative for cough.   Cardiovascular: Negative for leg swelling.  Gastrointestinal: Negative for nausea, diarrhea and constipation.  Genitourinary: Negative for dysuria and frequency.  Musculoskeletal: Negative for myalgias and arthralgias.  Skin: Negative for rash.  Neurological: Negative for headaches.  Hematological: Negative for adenopathy.  Psychiatric/Behavioral: Negative for dysphoric mood and agitation.       Denies anxiety       Objective:   Physical Exam        Assessment & Plan:

## 2014-05-30 ENCOUNTER — Encounter: Payer: Self-pay | Admitting: Family

## 2014-06-02 ENCOUNTER — Ambulatory Visit (INDEPENDENT_AMBULATORY_CARE_PROVIDER_SITE_OTHER): Payer: Medicare Other | Admitting: *Deleted

## 2014-06-02 DIAGNOSIS — M8580 Other specified disorders of bone density and structure, unspecified site: Secondary | ICD-10-CM | POA: Diagnosis not present

## 2014-06-02 MED ORDER — DENOSUMAB 60 MG/ML ~~LOC~~ SOLN
60.0000 mg | Freq: Once | SUBCUTANEOUS | Status: AC
  Administered 2014-06-02: 60 mg via SUBCUTANEOUS

## 2014-06-02 NOTE — Progress Notes (Signed)
Pre visit review using our clinic review tool, if applicable. No additional management support is needed unless otherwise documented below in the visit note.  Patient tolerated injection well.  

## 2014-06-26 DIAGNOSIS — H04123 Dry eye syndrome of bilateral lacrimal glands: Secondary | ICD-10-CM | POA: Diagnosis not present

## 2014-07-03 ENCOUNTER — Telehealth: Payer: Self-pay | Admitting: *Deleted

## 2014-07-03 ENCOUNTER — Ambulatory Visit
Admission: RE | Admit: 2014-07-03 | Discharge: 2014-07-03 | Disposition: A | Discharge status: Home / Self Care | Payer: Medicare Other | Source: Ambulatory Visit | Attending: Family | Admitting: Family

## 2014-07-03 ENCOUNTER — Ambulatory Visit (INDEPENDENT_AMBULATORY_CARE_PROVIDER_SITE_OTHER)
Admission: RE | Admit: 2014-07-03 | Discharge: 2014-07-03 | Disposition: A | Discharge status: Home / Self Care | Payer: Medicare Other | Source: Ambulatory Visit | Attending: Family | Admitting: Family

## 2014-07-03 DIAGNOSIS — Z780 Asymptomatic menopausal state: Secondary | ICD-10-CM

## 2014-07-03 DIAGNOSIS — M8580 Other specified disorders of bone density and structure, unspecified site: Secondary | ICD-10-CM

## 2014-07-03 NOTE — Telephone Encounter (Signed)
Imaging from [facility] stating that patient's Bone Density needs to include a diagnosis such as postmenopausal to be covered by insurance.  Re-ordered bone density to include postmenopausal.  Imaging tech confirmed that she received order and was correct.

## 2014-07-06 ENCOUNTER — Encounter: Payer: Self-pay | Admitting: Family

## 2014-07-11 NOTE — Progress Notes (Signed)
      HPI: FU mitral valve prolapse. Previously followed in [facility]. She has had previous carotid Dopplers and stress test; records are not available. Echocardiogram March 2015 showed normal LV function, grade 1 diastolic dysfunction, mild prolapse of the anterior mitral valve leaflet, mild mitral regurgitation, mild right ventricular enlargement and mild tricuspid regurgitation. Since she was last seen, the patient denies any dyspnea on exertion, orthopnea, PND, pedal edema, palpitations, syncope or chest pain.   Current Outpatient Prescriptions  Medication Sig Dispense Refill  . aspirin EC 81 MG tablet Take 81 mg by mouth daily.    . B Complex Vitamins (B-COMPLEX/B-12 PO) Take by mouth daily.    Marland Kitchen BIOTIN PO Take by mouth daily.    . Calcium Carbonate (CALTRATE 600 PO) Take by mouth.    . cetirizine (ZYRTEC) 10 MG tablet Take 10 mg by mouth daily as needed for allergies.    Marland Kitchen denosumab (PROLIA) 60 MG/ML SOLN injection Inject 60 mg into the skin every 6 (six) months. Administer in upper arm, thigh, or abdomen    . fluticasone (FLONASE) 50 MCG/ACT nasal spray Place 2 sprays into both nostrils daily. (Patient taking differently: Place 2 sprays into both nostrils daily as needed. ) 16 g 6  . meloxicam (MOBIC) 7.5 MG tablet Take 1 tablet (7.5 mg total) by mouth daily as needed. 30 tablet 2  . omeprazole (PRILOSEC) 20 MG capsule Take 1 capsule (20 mg total) by mouth daily. 30 capsule 5  . valsartan (DIOVAN) 40 MG tablet Take 1 tablet (40 mg total) by mouth daily. 30 tablet 0   No current facility-administered medications for this visit.     Past Medical History  Diagnosis Date  . Hypertension   . Hypercalcemia   . Osteoporosis, unspecified   . Mitral valve prolapse     Past Surgical History  Procedure Laterality Date  . Ganglion cyst excision Right 4 2012    wrist  . Tonsillectomy      History   Social History  . Marital Status: Married    Spouse Name: N/A  . Number  of Children: 3  . Years of Education: N/A   Occupational History  . Not on file.   Social History Main Topics  . Smoking status: Never Smoker   . Smokeless tobacco: Never Used  . Alcohol Use: 4.2 oz/week    7 Glasses of wine per week     Comment: daily  . Drug Use: No  . Sexual Activity: Not on file   Other Topics Concern  . Not on file   Social History Narrative   Married   3 daughters- 6 grandchildren   Retired from [employer]   Completed HS   Enjoys walking/reading, sightseeing, travel   Husband has parkinsons.      ROS: no fevers or chills, productive cough, hemoptysis, dysphasia, odynophagia, melena, hematochezia, dysuria, hematuria, rash, seizure activity, orthopnea, PND, pedal edema, claudication. Remaining systems are negative.  Physical Exam: Well-developed well-nourished in no acute distress.  Skin is warm and dry.  HEENT is normal.  Neck is supple.  Chest is clear to auscultation with normal expansion.  Cardiovascular exam is regular rate and rhythm.  Abdominal exam nontender or distended. No masses palpated. Extremities show no edema. neuro grossly intact  ECG sinus rhythm at a rate of 84. RV conduction delay. Nonspecific ST changes.

## 2014-07-12 ENCOUNTER — Encounter: Payer: Self-pay | Admitting: Cardiology

## 2014-07-12 ENCOUNTER — Ambulatory Visit (INDEPENDENT_AMBULATORY_CARE_PROVIDER_SITE_OTHER): Payer: Medicare Other | Admitting: Cardiology

## 2014-07-12 VITALS — BP 119/77 | HR 92 | Ht 67.5 in | Wt 135.0 lb

## 2014-07-12 DIAGNOSIS — I10 Essential (primary) hypertension: Secondary | ICD-10-CM | POA: Diagnosis not present

## 2014-07-12 DIAGNOSIS — I341 Nonrheumatic mitral (valve) prolapse: Secondary | ICD-10-CM

## 2014-07-12 NOTE — Assessment & Plan Note (Signed)
Mild prolapse on most recent echocardiogram. Mild mitral regurgitation. Will most likely repeat study in 5 years from previous.

## 2014-07-12 NOTE — Patient Instructions (Signed)
Your physician wants you to follow-up in: ONE YEAR WITH DR CRENSHAW You will receive a reminder letter in the mail two months in advance. If you don't receive a letter, please call our office to schedule the follow-up appointment.  

## 2014-07-12 NOTE — Assessment & Plan Note (Signed)
Blood pressure controlled. Continue present medications. 

## 2014-07-17 ENCOUNTER — Ambulatory Visit (HOSPITAL_BASED_OUTPATIENT_CLINIC_OR_DEPARTMENT_OTHER)
Admission: RE | Admit: 2014-07-17 | Discharge: 2014-07-17 | Disposition: A | Discharge status: Home / Self Care | Payer: Medicare Other | Source: Ambulatory Visit | Attending: Family | Admitting: Family

## 2014-07-17 ENCOUNTER — Ambulatory Visit (HOSPITAL_BASED_OUTPATIENT_CLINIC_OR_DEPARTMENT_OTHER): Payer: PRIVATE HEALTH INSURANCE

## 2014-07-17 DIAGNOSIS — Z1231 Encounter for screening mammogram for malignant neoplasm of breast: Secondary | ICD-10-CM | POA: Diagnosis not present

## 2014-07-17 DIAGNOSIS — Z1239 Encounter for other screening for malignant neoplasm of breast: Secondary | ICD-10-CM

## 2014-07-18 ENCOUNTER — Other Ambulatory Visit: Payer: Self-pay

## 2014-07-18 ENCOUNTER — Ambulatory Visit (INDEPENDENT_AMBULATORY_CARE_PROVIDER_SITE_OTHER): Payer: Medicare Other | Admitting: Internal Medicine

## 2014-07-18 ENCOUNTER — Encounter: Payer: Self-pay | Admitting: Internal Medicine

## 2014-07-18 VITALS — BP 124/78 | HR 90 | Temp 98.7°F | Ht 67.5 in | Wt 134.5 lb

## 2014-07-18 DIAGNOSIS — B349 Viral infection, unspecified: Secondary | ICD-10-CM

## 2014-07-18 MED ORDER — HYDROCODONE-HOMATROPINE 5-1.5 MG/5ML PO SYRP: 5.0000 mL | ORAL_SOLUTION | Freq: Every evening | ORAL | Status: DC | PRN

## 2014-07-18 NOTE — Patient Instructions (Signed)
Rest, fluids , tylenol take Mucinex DM twice a day as needed until better   OTC Nasocort or Flonase : 2 nasal sprays on each side of the nose daily until you feel better  For severe nocturnal cough use hydrocodone, will cause drowsiness   Call if not gradually better over the next  10 days Call anytime if the symptoms are severe

## 2014-07-18 NOTE — Progress Notes (Signed)
Subjective:    Patient ID: Mary Stevens, female    DOB: 09/21/1940, 74 y.o.   MRN: [id number]  DOS:  07/18/2014 Type of visit - description : acute Interval history: Symptoms started suddenly last night with cough, malaise, feeling tired, chest rattling. She's not taking any medication for her symptoms.   Review of Systems Denies fever chills Mild sinus pain or congestion No nausea, vomiting, diarrhea No sputum production Mild headaches, no rash  Past Medical History  Diagnosis Date  . Hypertension   . Hypercalcemia   . Osteoporosis, unspecified   . Mitral valve prolapse     Past Surgical History  Procedure Laterality Date  . Ganglion cyst excision Right 4 2012    wrist  . Tonsillectomy      History   Social History  . Marital Status: Married    Spouse Name: N/A  . Number of Children: 3  . Years of Education: N/A   Occupational History  . Not on file.   Social History Main Topics  . Smoking status: Never Smoker   . Smokeless tobacco: Never Used  . Alcohol Use: 4.2 oz/week    7 Glasses of wine per week     Comment: daily  . Drug Use: No  . Sexual Activity: Not on file   Other Topics Concern  . Not on file   Social History Narrative   Married   3 daughters- 6 grandchildren   Retired from Medical [employer]   Completed HS   Enjoys walking/reading, sightseeing, travel   Husband has parkinsons.          Medication List       This list is accurate as of: 07/18/14 11:59 PM.  Always use your most recent med list.               aspirin EC 81 MG tablet  Take 81 mg by mouth daily.     B-COMPLEX/B-12 PO  Take by mouth daily.     BIOTIN PO  Take by mouth daily.     CALTRATE 600 PO  Take by mouth.     cetirizine 10 MG tablet  Commonly known as:  ZYRTEC  Take 10 mg by mouth daily as needed for allergies.     denosumab 60 MG/ML Soln injection  Commonly known as:  PROLIA  Inject 60 mg into the skin every 6 (six) months. Administer  in upper arm, thigh, or abdomen     fluticasone 50 MCG/ACT nasal spray  Commonly known as:  FLONASE  Place 2 sprays into both nostrils daily.     HYDROcodone-homatropine 5-1.5 MG/5ML syrup  Commonly known as:  HYCODAN  Take 5 mLs by mouth at bedtime as needed for cough.     meloxicam 7.5 MG tablet  Commonly known as:  MOBIC  Take 1 tablet (7.5 mg total) by mouth daily as needed.     omeprazole 20 MG capsule  Commonly known as:  PRILOSEC  Take 1 capsule (20 mg total) by mouth daily.     valsartan 40 MG tablet  Commonly known as:  DIOVAN  Take 1 tablet (40 mg total) by mouth daily.           Objective:   Physical Exam BP 124/78 mmHg  Pulse 90  Temp(Src) 98.7 F (37.1 C) (Oral)  Ht 5' 7.5" (1.715 m)  Wt 134 lb 8 oz (61.009 kg)  BMI 20.74 kg/m2  SpO2 98% General:   Well developed, well nourished .  NAD except for frequent cough HEENT:  Normocephalic . Face symmetric, atraumatic. Nose congested, sinuses: Slightly TTP at the left frontal area otherwise no tender Lungs:  CTA B (few rhonchi?) Normal respiratory effort, no intercostal retractions, no accessory muscle use. Heart: RRR,  no murmur.  No pretibial edema bilaterally  Skin: Not pale. Not jaundice Neurologic:  alert & oriented X3.  Speech normal, gait appropriate for age and unassisted Psych--  Cognition and judgment appear intact.  Cooperative with normal attention span and concentration.  Behavior appropriate. No anxious or depressed appearing.        Assessment & Plan:    Viral syndrome, Presents with respiratory symptoms of acute onset, in no distress, vital signs stable. Viral syndrome? Recommend conservative treatment, she seems to be coughing quite frequently, hydrocodone at bedtime. See  instructions

## 2014-07-18 NOTE — Progress Notes (Signed)
Pre visit review using our clinic review tool, if applicable. No additional management support is needed unless otherwise documented below in the visit note. 

## 2014-08-11 DIAGNOSIS — L570 Actinic keratosis: Secondary | ICD-10-CM | POA: Diagnosis not present

## 2014-10-02 ENCOUNTER — Telehealth: Payer: Self-pay | Admitting: Family

## 2014-10-02 NOTE — Telephone Encounter (Signed)
Noted.  See [facility] Note.

## 2014-10-02 NOTE — Telephone Encounter (Signed)
Pt called with congestion/headache/nausea/dizziness. Pt scheduled for 8/9 7:00am per her request and transferred to [facility] [name]).

## 2014-10-02 NOTE — Telephone Encounter (Signed)
Appointment scheduled by [facility] with Elyn Aquas, [place] 10/03/14.   Forwarded to provider for FYI.

## 2014-10-02 NOTE — Telephone Encounter (Signed)
Patient Name: Mary Stevens  DOB: Apr 13, 1940    Initial Comment Caller states she feels like she has sinus infection, dizzy and nauseated.   Nurse Assessment  Nurse: Leilani Merl, RN, [place] Date/Time (Eastern Time): 10/02/2014 11:34:37 AM  Confirm and document reason for call. If symptomatic, describe symptoms. ---Caller states she feels like she has sinus infection, dizzy and nauseated. This started on Friday night, this morning she is having sinus pressure.  Has the patient traveled out of the country within the last 30 days? ---Not Applicable  Does the patient require triage? ---Yes  Related visit to physician within the last 2 weeks? ---No  Does the PT have any chronic conditions? (i.e. diabetes, asthma, etc.) ---Yes  List chronic conditions. ---HTN, MVP     Guidelines    Guideline Title Affirmed Question Affirmed Notes  Sinus Pain or Congestion [1] Sinus pain (not just congestion) AND [2] fever    Final Disposition User   See Physician within 24 Hours [place], RN, Conserv[employer], [employer]gallery states that an appt has already been made through the office for tomorrow morning. She will wait until then to be seen unless she gets worse.   Referrals  GO TO FACILITY UNDECIDED   Disagree/Comply: Comply

## 2014-10-03 ENCOUNTER — Ambulatory Visit (INDEPENDENT_AMBULATORY_CARE_PROVIDER_SITE_OTHER): Payer: Medicare Other | Admitting: Physician Assistant

## 2014-10-03 ENCOUNTER — Encounter: Payer: Self-pay | Admitting: Physician Assistant

## 2014-10-03 VITALS — BP 118/82 | HR 74 | Temp 97.7°F | Ht 67.5 in | Wt 133.4 lb

## 2014-10-03 DIAGNOSIS — H8113 Benign paroxysmal vertigo, bilateral: Secondary | ICD-10-CM

## 2014-10-03 DIAGNOSIS — H8110 Benign paroxysmal vertigo, unspecified ear: Secondary | ICD-10-CM | POA: Insufficient documentation

## 2014-10-03 MED ORDER — MECLIZINE HCL 25 MG PO TABS: 25.0000 mg | ORAL_TABLET | Freq: Three times a day (TID) | ORAL | Status: DC | PRN

## 2014-10-03 NOTE — Progress Notes (Signed)
Pre visit review using our clinic review tool, if applicable. No additional management support is needed unless otherwise documented below in the visit note. 

## 2014-10-03 NOTE — Assessment & Plan Note (Signed)
+   Mary Stevens. Rx Meclizine for dizziness. Epley Maneuvers reviewed and handout given so exercises can be done at home. Follow-up 1 week. Return immediately or ER if anything worsens.

## 2014-10-03 NOTE — Progress Notes (Signed)
Patient presents to clinic today c/o vertigo starting Saturday associated with nausea and an episode of non-bloody emesis. Nausea has markedly improved but fatigue and dizziness persists. Vertigo worse with forward leaning. Denies trauma or injury. Denies fever, chills, sinus pressure, URI symptom.  Denies change in hearing or ear pressure.  Denies chest pain, palpitations or SOB.  Past Medical History  Diagnosis Date  . Hypertension   . Hypercalcemia   . Osteoporosis, unspecified   . Mitral valve prolapse     Current Outpatient Prescriptions on File Prior to Visit  Medication Sig Dispense Refill  . aspirin EC 81 MG tablet Take 81 mg by mouth daily.    . B Complex Vitamins (B-COMPLEX/B-12 PO) Take by mouth daily.    Marland Kitchen BIOTIN PO Take by mouth daily.    . Calcium Carbonate (CALTRATE 600 PO) Take by mouth.    . cetirizine (ZYRTEC) 10 MG tablet Take 10 mg by mouth daily as needed for allergies.    Marland Kitchen denosumab (PROLIA) 60 MG/ML SOLN injection Inject 60 mg into the skin every 6 (six) months. Administer in upper arm, thigh, or abdomen    . fluticasone (FLONASE) 50 MCG/ACT nasal spray Place 2 sprays into both nostrils daily. (Patient taking differently: Place 2 sprays into both nostrils daily as needed. ) 16 g 6  . HYDROcodone-homatropine (HYCODAN) 5-1.5 MG/5ML syrup Take 5 mLs by mouth at bedtime as needed for cough. 120 mL 0  . meloxicam (MOBIC) 7.5 MG tablet Take 1 tablet (7.5 mg total) by mouth daily as needed. 30 tablet 2  . omeprazole (PRILOSEC) 20 MG capsule Take 1 capsule (20 mg total) by mouth daily. 30 capsule 5  . valsartan (DIOVAN) 40 MG tablet Take 1 tablet (40 mg total) by mouth daily. 30 tablet 0   No current facility-administered medications on file prior to visit.    Allergies  Allergen Reactions  . Bactrim [Sulfamethoxazole-Trimethoprim]     Patient unsure.  Marland Kitchen Penicillins Hives    Family History  Problem Relation Age of Onset  . Heart disease Mother     CABG in mid  31s  . Cancer Father     liver  . Diabetes Neg Hx   . Kidney disease Neg Hx   . Asthma Neg Hx     History   Social History  . Marital Status: Married    Spouse Name: N/A  . Number of Children: 3  . Years of Education: N/A   Social History Main Topics  . Smoking status: Never Smoker   . Smokeless tobacco: Never Used  . Alcohol Use: 4.2 oz/week    7 Glasses of wine per week     Comment: daily  . Drug Use: No  . Sexual Activity: Not on file   Other Topics Concern  . None   Social History Narrative   Married   3 daughters- 6 grandchildren   Retired from [employer]   Completed HS   Enjoys walking/reading, sightseeing, travel   Husband has parkinsons.     Review of Systems - See HPI.  All other ROS are negative.  BP 118/82 mmHg  Pulse 74  Temp(Src) 97.7 F (36.5 C) (Oral)  Ht 5' 7.5" (1.715 m)  Wt 133 lb 6.4 oz (60.51 kg)  BMI 20.57 kg/m2  SpO2 98%  Physical Exam  Constitutional: She is oriented to person, place, and time and well-developed, well-nourished, and in no distress.  HENT:  Head: Normocephalic and atraumatic.  Right Ear:  Tympanic membrane normal.  Left Ear: Tympanic membrane normal.  Nose: Nose normal. Right sinus exhibits no maxillary sinus tenderness and no frontal sinus tenderness. Left sinus exhibits no maxillary sinus tenderness and no frontal sinus tenderness.  Mouth/Throat: Uvula is midline, oropharynx is clear and moist and mucous membranes are normal.  Eyes: Conjunctivae are normal.  Neck: Neck supple.  Cardiovascular: Normal rate, regular rhythm, normal heart sounds and intact distal pulses.   Pulmonary/Chest: Effort normal and breath sounds normal. No respiratory distress. She has no wheezes. She has no rales. She exhibits no tenderness.  Lymphadenopathy:    She has no cervical adenopathy.  Neurological: She is alert and oriented to person, place, and time.  Skin: Skin is warm and dry. No rash noted.  Psychiatric: Affect  normal.  Vitals reviewed.  No results found for this or any previous visit (from the past 2160 hour(s)).  Assessment/Plan: Benign paroxysmal positional vertigo + [employer]. Rx Meclizine for dizziness. Epley Maneuvers reviewed and handout given so exercises can be done at home. Follow-up 1 week. Return immediately or ER if anything worsens.

## 2014-10-03 NOTE — Patient Instructions (Addendum)
Please stay well hydrated but limit salt intake. Rest. Continue allergy medications as directed. Take the Meclizine as directed, if needed for dizziness. Please do the exercises below to help resolve symptoms. Ginger ale will be beneficial for nausea if it recurs.  Follow-up 1 week. Return sooner if anything worsens.  Epley Maneuver Self-Care WHAT IS THE EPLEY MANEUVER? The Epley maneuver is an exercise you can do to relieve symptoms of benign paroxysmal positional vertigo (BPPV). This condition is often just referred to as vertigo. BPPV is caused by the movement of tiny crystals (canaliths) inside your inner ear. The accumulation and movement of canaliths in your inner ear causes a sudden spinning sensation (vertigo) when you move your head to certain positions. Vertigo usually lasts about 30 seconds. BPPV usually occurs in just one ear. If you get vertigo when you lie on your left side, you probably have BPPV in your left ear. Your health care provider can tell you which ear is involved.  BPPV may be caused by a head injury. Many people older than 50 get BPPV for unknown reasons. If you have been diagnosed with BPPV, your health care provider may teach you how to do this maneuver. BPPV is not life threatening (benign) and usually goes away in time.  WHEN SHOULD I PERFORM THE EPLEY MANEUVER? You can do this maneuver at home whenever you have symptoms of vertigo. You may do the Epley maneuver up to 3 times a day until your symptoms of vertigo go away. HOW SHOULD I DO THE EPLEY MANEUVER? 1. Sit on the edge of a bed or table with your back straight. Your legs should be extended or hanging over the edge of the bed or table.  2. Turn your head halfway toward the affected ear.  3. Lie backward quickly with your head turned until you are lying flat on your back. You may want to position a pillow under your shoulders.  4. Hold this position for 30 seconds. You may experience an attack of vertigo. This  is normal. Hold this position until the vertigo stops. 5. Then turn your head to the opposite direction until your unaffected ear is facing the floor.  6. Hold this position for 30 seconds. You may experience an attack of vertigo. This is normal. Hold this position until the vertigo stops. 7. Now turn your whole body to the same side as your head. Hold for another 30 seconds.  8. You can then sit back up. ARE THERE RISKS TO THIS MANEUVER? In some cases, you may have other symptoms (such as changes in your vision, weakness, or numbness). If you have these symptoms, stop doing the maneuver and call your health care provider. Even if doing these maneuvers relieves your vertigo, you may still have dizziness. Dizziness is the sensation of light-headedness but without the sensation of movement. Even though the Epley maneuver may relieve your vertigo, it is possible that your symptoms will return within 5 years. WHAT SHOULD I DO AFTER THIS MANEUVER? After doing the Epley maneuver, you can return to your normal activities. Ask your doctor if there is anything you should do at home to prevent vertigo. This may include:  Sleeping with two or more pillows to keep your head elevated.  Not sleeping on the side of your affected ear.  Getting up slowly from bed.  Avoiding sudden movements during the day.  Avoiding extreme head movement, like looking up or bending over.  Wearing a cervical collar to prevent sudden head  movements. WHAT SHOULD I DO IF MY SYMPTOMS GET WORSE? Call your health care provider if your vertigo gets worse. Call your provider right way if you have other symptoms, including:   Nausea.  Vomiting.  Headache.  Weakness.  Numbness.  Vision changes. Document Released: 02/15/2013 Document Reviewed: 02/15/2013 [facility] Patient Information 2015 [place], [place]. This information is not intended to replace advice given to you by your health care provider. Make sure you discuss any  questions you have with your health care provider.

## 2014-10-09 ENCOUNTER — Ambulatory Visit: Payer: PRIVATE HEALTH INSURANCE | Admitting: Physician Assistant

## 2014-10-13 DIAGNOSIS — L821 Other seborrheic keratosis: Secondary | ICD-10-CM | POA: Diagnosis not present

## 2014-10-13 DIAGNOSIS — L578 Other skin changes due to chronic exposure to nonionizing radiation: Secondary | ICD-10-CM | POA: Diagnosis not present

## 2014-10-13 DIAGNOSIS — D1801 Hemangioma of skin and subcutaneous tissue: Secondary | ICD-10-CM | POA: Diagnosis not present

## 2014-10-13 DIAGNOSIS — Z809 Family history of malignant neoplasm, unspecified: Secondary | ICD-10-CM | POA: Diagnosis not present

## 2014-10-17 ENCOUNTER — Encounter: Payer: Self-pay | Admitting: Family

## 2014-11-07 ENCOUNTER — Ambulatory Visit (INDEPENDENT_AMBULATORY_CARE_PROVIDER_SITE_OTHER): Payer: Medicare Other | Admitting: Family

## 2014-11-07 ENCOUNTER — Encounter: Payer: Self-pay | Admitting: Family

## 2014-11-07 ENCOUNTER — Telehealth: Payer: Self-pay | Admitting: *Deleted

## 2014-11-07 VITALS — BP 120/82 | HR 82 | Temp 98.0°F | Resp 16 | Ht 65.75 in | Wt 134.6 lb

## 2014-11-07 DIAGNOSIS — I10 Essential (primary) hypertension: Secondary | ICD-10-CM

## 2014-11-07 DIAGNOSIS — Z636 Dependent relative needing care at home: Secondary | ICD-10-CM

## 2014-11-07 DIAGNOSIS — Z23 Encounter for immunization: Secondary | ICD-10-CM

## 2014-11-07 DIAGNOSIS — M810 Age-related osteoporosis without current pathological fracture: Secondary | ICD-10-CM | POA: Diagnosis not present

## 2014-11-07 NOTE — Patient Instructions (Signed)
Please complete lab work prior to leaving.   

## 2014-11-07 NOTE — Telephone Encounter (Signed)
Pt here for OV today and requested letter of clearance for dental work with [facility], [place]. Copy given to pt and faxed to [phone]. Pt aware.

## 2014-11-07 NOTE — Progress Notes (Signed)
Pre visit review using our clinic review tool, if applicable. No additional management support is needed unless otherwise documented below in the visit note. 

## 2014-11-07 NOTE — Progress Notes (Signed)
Subjective:    Patient ID: Mary Stevens, female    DOB: [birth year], 74 y.o.   MRN: [id number]  HPI  Mary Stevens is a 74 yr old female who presents today requesting a letter for her dentist clearing her for her upcoming endontal surgery.  (gum surgery) She is on Prolia and her dentist was concerned.  She reports stress in caring for her husband who has PD. He has recently worsened and this has been hard on both of them. She is considering moving back to [place] to be near family.   Review of Systems See HPI  Past Medical History  Diagnosis Date  . Hypertension   . Hypercalcemia   . Osteoporosis, unspecified   . Mitral valve prolapse     Social History   Social History  . Marital Status: Married    Spouse Name: N/A  . Number of Children: 3  . Years of Education: N/A   Occupational History  . Not on file.   Social History Main Topics  . Smoking status: Never Smoker   . Smokeless tobacco: Never Used  . Alcohol Use: 4.2 oz/week    7 Glasses of wine per week     Comment: daily  . Drug Use: No  . Sexual Activity: Not on file   Other Topics Concern  . Not on file   Social History Narrative   Married   3 daughters- 6 grandchildren   Retired from [employer]   Completed HS   Enjoys walking/reading, sightseeing, travel   Husband has parkinsons.      Past Surgical History  Procedure Laterality Date  . Ganglion cyst excision Right 4 2012    wrist  . Tonsillectomy      Family History  Problem Relation Age of Onset  . Heart disease Mother     CABG in mid 81s  . Cancer Father     liver  . Diabetes Neg Hx   . Kidney disease Neg Hx   . Asthma Neg Hx     Allergies  Allergen Reactions  . Bactrim [Sulfamethoxazole-Trimethoprim]     Patient unsure.  Marland Kitchen Penicillins Hives    Current Outpatient Prescriptions on File Prior to Visit  Medication Sig Dispense Refill  . aspirin EC 81 MG tablet Take 81 mg by mouth daily.    . B Complex Vitamins  (B-COMPLEX/B-12 PO) Take by mouth daily.    Marland Kitchen BIOTIN PO Take by mouth daily.    . Calcium Carbonate (CALTRATE 600 PO) Take by mouth.    . cetirizine (ZYRTEC) 10 MG tablet Take 10 mg by mouth daily as needed for allergies.    Marland Kitchen denosumab (PROLIA) 60 MG/ML SOLN injection Inject 60 mg into the skin every 6 (six) months. Administer in upper arm, thigh, or abdomen    . fluticasone (FLONASE) 50 MCG/ACT nasal spray Place 2 sprays into both nostrils daily. (Patient taking differently: Place 2 sprays into both nostrils daily as needed. ) 16 g 6  . meclizine (ANTIVERT) 25 MG tablet Take 1 tablet (25 mg total) by mouth 3 (three) times daily as needed for dizziness. 60 tablet 0  . meloxicam (MOBIC) 7.5 MG tablet Take 1 tablet (7.5 mg total) by mouth daily as needed. 30 tablet 2  . omeprazole (PRILOSEC) 20 MG capsule Take 1 capsule (20 mg total) by mouth daily. 30 capsule 5  . valsartan (DIOVAN) 40 MG tablet Take 1 tablet (40 mg total) by mouth daily. 30 tablet 0  No current facility-administered medications on file prior to visit.    BP 120/82 mmHg  Pulse 82  Temp(Src) 98 F (36.7 C) (Oral)  Resp 16  Ht 5' 5.75" (1.67 m)  Wt 134 lb 9.6 oz (61.054 kg)  BMI 21.89 kg/m2  SpO2 99%       Objective:   Physical Exam  Constitutional: She is oriented to person, place, and time. She appears well-developed and well-nourished.  Neurological: She is alert and oriented to person, place, and time.  Psychiatric: Her behavior is normal. Judgment and thought content normal.  Tearful upon discussion about her husband.          Assessment & Plan:

## 2014-11-09 DIAGNOSIS — Z636 Dependent relative needing care at home: Secondary | ICD-10-CM | POA: Insufficient documentation

## 2014-11-09 NOTE — Assessment & Plan Note (Signed)
15 minutes spent with pt today. >50% of this time was spent counseling patient on stress related to caring for her husband.  I encouraged her to look into a support group for family members of Parkinson's patients.

## 2014-11-09 NOTE — Assessment & Plan Note (Signed)
I reviewed the literature in up to date and cannot find any contraindication to up coming dental procedure while on prolia.  Letter completed.

## 2014-11-23 ENCOUNTER — Encounter: Payer: Self-pay | Admitting: Family

## 2014-11-27 ENCOUNTER — Other Ambulatory Visit (INDEPENDENT_AMBULATORY_CARE_PROVIDER_SITE_OTHER): Payer: Medicare Other

## 2014-11-27 ENCOUNTER — Encounter: Payer: Self-pay | Admitting: Family

## 2014-11-27 ENCOUNTER — Ambulatory Visit: Payer: Medicare Other | Admitting: Family

## 2014-11-27 DIAGNOSIS — I10 Essential (primary) hypertension: Secondary | ICD-10-CM | POA: Diagnosis not present

## 2014-11-27 LAB — BASIC METABOLIC PANEL
BUN: 14 mg/dL (ref 6–23)
CHLORIDE: 105 meq/L (ref 96–112)
CO2: 28 meq/L (ref 19–32)
Calcium: 10.2 mg/dL (ref 8.4–10.5)
Creatinine, Ser: 0.88 mg/dL (ref 0.40–1.20)
GFR: 66.89 mL/min (ref >60.00)
Glucose, Bld: 90 mg/dL (ref 70–99)
POTASSIUM: 3.7 meq/L (ref 3.5–5.1)
Sodium: 141 mEq/L (ref 135–145)

## 2014-12-06 DIAGNOSIS — M272 Inflammatory conditions of jaws: Secondary | ICD-10-CM | POA: Diagnosis not present

## 2014-12-06 DIAGNOSIS — K045 Chronic apical periodontitis: Secondary | ICD-10-CM | POA: Diagnosis not present

## 2014-12-29 ENCOUNTER — Encounter: Payer: Self-pay | Admitting: Medical

## 2014-12-29 ENCOUNTER — Ambulatory Visit (INDEPENDENT_AMBULATORY_CARE_PROVIDER_SITE_OTHER): Payer: Medicare Other | Admitting: Medical

## 2014-12-29 VITALS — BP 116/78 | HR 87 | Temp 98.3°F | Ht 65.75 in | Wt 137.0 lb

## 2014-12-29 DIAGNOSIS — J029 Acute pharyngitis, unspecified: Secondary | ICD-10-CM | POA: Diagnosis not present

## 2014-12-29 DIAGNOSIS — B49 Unspecified mycosis: Secondary | ICD-10-CM | POA: Diagnosis not present

## 2014-12-29 DIAGNOSIS — J302 Other seasonal allergic rhinitis: Secondary | ICD-10-CM | POA: Diagnosis not present

## 2014-12-29 MED ORDER — BENZONATATE 100 MG PO CAPS: 100.0000 mg | ORAL_CAPSULE | Freq: Three times a day (TID) | ORAL | Status: DC | PRN

## 2014-12-29 MED ORDER — FLUCONAZOLE 150 MG PO TABS: ORAL_TABLET | ORAL | Status: DC

## 2014-12-29 NOTE — Progress Notes (Signed)
Subjective:    Patient ID: Mary Stevens, female    DOB: [birth year], 74 y.o.   MRN: [id number]  HPI   St for about 5 days. No body aches. No fever, no chills or sweats. Mild ha. Pt does have mild cough that just started. No sneezing or itching eyes. Pt uses otc antihistamine and flonase.  Pt describes some pain in throat and little lower.  Pt has fall and spring allergies.  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  Pt has been on antibiotic up until this past Thursday.She not sure what type of antibioitic but was oral surgery.    Review of Systems  Constitutional: Negative for fever, chills and fatigue.  HENT: Positive for congestion and sore throat. Negative for drooling, ear pain, postnasal drip, sinus pressure and trouble swallowing.   Respiratory: Positive for cough. Negative for chest tightness, shortness of breath and wheezing.   Cardiovascular: Negative for chest pain and palpitations.  Gastrointestinal: Negative for abdominal pain.  Musculoskeletal: Negative for back pain.  Neurological: Positive for headaches. Negative for dizziness, weakness and light-headedness.  Hematological: Negative for adenopathy. Does not bruise/bleed easily.   Past Medical History  Diagnosis Date  . Hypertension   . Hypercalcemia   . Osteoporosis, unspecified   . Mitral valve prolapse     Social History   Social History  . Marital Status: Married    Spouse Name: N/A  . Number of Children: 3  . Years of Education: N/A   Occupational History  . Not on file.   Social History Main Topics  . Smoking status: Never Smoker   . Smokeless tobacco: Never Used  . Alcohol Use: 4.2 oz/week    7 Glasses of wine per week     Comment: daily  . Drug Use: No  . Sexual Activity: Not on file   Other Topics Concern  . Not on file   Social History Narrative   Married   3 daughters- 6 grandchildren   Retired from [employer]   Completed HS   Enjoys walking/reading,  sightseeing, travel   Husband has parkinsons.      Past Surgical History  Procedure Laterality Date  . Ganglion cyst excision Right 4 2012    wrist  . Tonsillectomy      Family History  Problem Relation Age of Onset  . Heart disease Mother     CABG in mid 11s  . Cancer Father     liver  . Diabetes Neg Hx   . Kidney disease Neg Hx   . Asthma Neg Hx     Allergies  Allergen Reactions  . Bactrim [Sulfamethoxazole-Trimethoprim]     Patient unsure.  Marland Kitchen Penicillins Hives    Current Outpatient Prescriptions on File Prior to Visit  Medication Sig Dispense Refill  . aspirin EC 81 MG tablet Take 81 mg by mouth daily.    Marland Kitchen BIOTIN PO Take by mouth daily.    . Calcium Carbonate (CALTRATE 600 PO) Take by mouth.    . cetirizine (ZYRTEC) 10 MG tablet Take 10 mg by mouth daily as needed for allergies.    Marland Kitchen denosumab (PROLIA) 60 MG/ML SOLN injection Inject 60 mg into the skin every 6 (six) months. Administer in upper arm, thigh, or abdomen    . fluticasone (FLONASE) 50 MCG/ACT nasal spray Place 2 sprays into both nostrils daily. (Patient taking differently: Place 2 sprays into both nostrils daily as needed. ) 16 g 6  . meclizine (ANTIVERT)  25 MG tablet Take 1 tablet (25 mg total) by mouth 3 (three) times daily as needed for dizziness. 60 tablet 0  . meloxicam (MOBIC) 7.5 MG tablet Take 1 tablet (7.5 mg total) by mouth daily as needed. 30 tablet 2  . omeprazole (PRILOSEC) 20 MG capsule Take 1 capsule (20 mg total) by mouth daily. 30 capsule 5  . valsartan (DIOVAN) 40 MG tablet Take 1 tablet (40 mg total) by mouth daily. 30 tablet 0   No current facility-administered medications on file prior to visit.    BP 116/78 mmHg  Pulse 87  Temp(Src) 98.3 F (36.8 C) (Oral)  Ht 5' 5.75" (1.67 m)  Wt 137 lb (62.143 kg)  BMI 22.28 kg/m2  SpO2 98%       Objective:   Physical Exam  General  Mental Status - Alert. General Appearance - Well groomed. Not in acute distress.  Skin Rashes- No  Rashes.  HEENT Head- Normal. Ear Auditory Canal - Left- Normal. Right - Normal.Tympanic Membrane- Left- Normal. Right- Normal. Eye Sclera/Conjunctiva- Left- Normal. Right- Normal. Nose & Sinuses Nasal Mucosa- Left-  Mild boggy + Congested. Right-  Mild   boggy + Congested. Mouth & Throat Lips: Upper Lip- Normal: no dryness, cracking, pallor, cyanosis, or vesicular eruption. Lower Lip-Normal: no dryness, cracking, pallor, cyanosis or vesicular eruption. Buccal Mucosa- Bilateral- No Aphthous ulcers. Oropharynx- No Discharge or Erythema. +pnd Tonsils: Characteristics- Bilateral- No Erythema or Congestion. Size/Enlargement- Bilateral- No enlargement. Discharge- bilateral-None.  Neck Neck- Supple. No Masses. No lymphadenopathy   Chest and Lung Exam Auscultation: Breath Sounds:- even and unlabored  Cardiovascular Auscultation:Rythm- Regular, rate and rhythm. Murmurs & Other Heart Sounds:Ausculatation of the heart reveal- No Murmurs.  Lymphatic Head & Neck General Head & Neck Lymphatics: Bilateral: Description- No Localized lymphadenopathy.        Assessment & Plan:  You have taken antibiotic for 21 days up until last Thursday. Your rapid strep was negative and exam is not suspicious. 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  I think with antibiotic use recently that it would be good idea to give diflucan today and tomorrow. Assess response. Call us Monday with update.  Start flonase and continue antihistamine. Decrease pnd as this can contribute to st at times.  Follow up in 7 days or as needed

## 2014-12-29 NOTE — Addendum Note (Signed)
Addended by: Tasia Catchings on: 12/29/2014 04:18 PM   Modules accepted: Orders

## 2014-12-29 NOTE — Progress Notes (Signed)
Pre visit review using our clinic review tool, if applicable. No additional management support is needed unless otherwise documented below in the visit note. 

## 2014-12-29 NOTE — Patient Instructions (Signed)
You have taken antibiotic for 21 days up until last Thursday. Your rapid strep was negative and exam is not suspicious. 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  I think with antibiotic use recently that it would be good idea to give diflucan today and tomorrow. Assess response. Call us Monday with update.  Start flonase and continue antihistamine. Decrease pnd as this can contribute to st at times.  Follow up in 7 days or as needed

## 2014-12-31 LAB — CULTURE, GROUP A STREP: ORGANISM ID, BACTERIA: NORMAL

## 2015-01-11 DIAGNOSIS — H5203 Hypermetropia, bilateral: Secondary | ICD-10-CM | POA: Diagnosis not present

## 2015-01-11 DIAGNOSIS — H04123 Dry eye syndrome of bilateral lacrimal glands: Secondary | ICD-10-CM | POA: Diagnosis not present

## 2015-01-11 DIAGNOSIS — H2513 Age-related nuclear cataract, bilateral: Secondary | ICD-10-CM | POA: Diagnosis not present

## 2015-01-30 ENCOUNTER — Telehealth: Payer: Self-pay | Admitting: Family

## 2015-01-30 NOTE — Telephone Encounter (Signed)
Patient presented with the MOST form today at her husband's appointment. Form was reviewed and filled with the patient.

## 2015-05-05 ENCOUNTER — Other Ambulatory Visit: Payer: Self-pay | Admitting: Family

## 2015-05-18 ENCOUNTER — Ambulatory Visit (HOSPITAL_BASED_OUTPATIENT_CLINIC_OR_DEPARTMENT_OTHER)
Admission: RE | Admit: 2015-05-18 | Discharge: 2015-05-18 | Disposition: A | Discharge status: Home / Self Care | Payer: Medicare Other | Source: Ambulatory Visit | Attending: Family | Admitting: Family

## 2015-05-18 ENCOUNTER — Encounter: Payer: Self-pay | Admitting: Family

## 2015-05-18 ENCOUNTER — Ambulatory Visit (INDEPENDENT_AMBULATORY_CARE_PROVIDER_SITE_OTHER): Payer: Medicare Other | Admitting: Family

## 2015-05-18 VITALS — BP 112/64 | HR 94 | Temp 98.0°F | Resp 16 | Ht 65.75 in | Wt 138.4 lb

## 2015-05-18 DIAGNOSIS — I10 Essential (primary) hypertension: Secondary | ICD-10-CM | POA: Diagnosis not present

## 2015-05-18 DIAGNOSIS — M25472 Effusion, left ankle: Secondary | ICD-10-CM | POA: Diagnosis not present

## 2015-05-18 DIAGNOSIS — M7989 Other specified soft tissue disorders: Secondary | ICD-10-CM | POA: Diagnosis not present

## 2015-05-18 LAB — BASIC METABOLIC PANEL
BUN: 16 mg/dL (ref 6–23)
CO2: 29 mEq/L (ref 19–32)
CREATININE: 0.81 mg/dL (ref 0.40–1.20)
Calcium: 10 mg/dL (ref 8.4–10.5)
Chloride: 106 mEq/L (ref 96–112)
GFR: 73.5 mL/min (ref >60.00)
GLUCOSE: 85 mg/dL (ref 70–99)
POTASSIUM: 3.5 meq/L (ref 3.5–5.1)
Sodium: 141 mEq/L (ref 135–145)

## 2015-05-18 MED ORDER — VALSARTAN 40 MG PO TABS: 40.0000 mg | ORAL_TABLET | Freq: Every day | ORAL | Status: DC

## 2015-05-18 NOTE — Patient Instructions (Signed)
Please complete x-rays on the first floor. Contact your dermatologist to evaluate skin lesion on your right leg.

## 2015-05-18 NOTE — Progress Notes (Signed)
Pre visit review using our clinic review tool, if applicable. No additional management support is needed unless otherwise documented below in the visit note/SLS  

## 2015-05-18 NOTE — Progress Notes (Signed)
Subjective:    Patient ID: Mary Stevens, female    DOB: 09-03-1940, 75 y.o.   MRN: [id number]  HPI  Mary Stevens is a 75 yr old female who presents today with chief complaint of swelling in the LLE/foot. This swelling began following a twisting injury 6 weeks ago. Denies pain with movement or weight bearing.   HTN- She continues diovan (only taking 40mg  once daily)  BP Readings from Last 3 Encounters:  05/18/15 112/64  12/29/14 116/78  11/07/14 120/82    Review of Systems See HPI  Past Medical History  Diagnosis Date  . Hypertension   . Hypercalcemia   . Osteoporosis, unspecified   . Mitral valve prolapse     Social History   Social History  . Marital Status: Married    Spouse Name: N/A  . Number of Children: 3  . Years of Education: N/A   Occupational History  . Not on file.   Social History Main Topics  . Smoking status: Never Smoker   . Smokeless tobacco: Never Used  . Alcohol Use: 4.2 oz/week    7 Glasses of wine per week     Comment: daily  . Drug Use: No  . Sexual Activity: Not on file   Other Topics Concern  . Not on file   Social History Narrative   Married   3 daughters- 6 grandchildren   Retired from [employer]   Completed HS   Enjoys walking/reading, sightseeing, travel   Husband has parkinsons.      Past Surgical History  Procedure Laterality Date  . Ganglion cyst excision Right 4 2012    wrist  . Tonsillectomy      Family History  Problem Relation Age of Onset  . Heart disease Mother     CABG in mid 44s  . Cancer Father     liver  . Diabetes Neg Hx   . Kidney disease Neg Hx   . Asthma Neg Hx     Allergies  Allergen Reactions  . Bactrim [Sulfamethoxazole-Trimethoprim]     Patient unsure.  Marland Kitchen Penicillins Hives    Current Outpatient Prescriptions on File Prior to Visit  Medication Sig Dispense Refill  . aspirin EC 81 MG tablet Take 81 mg by mouth daily.    Marland Kitchen BIOTIN PO Take by mouth daily.    . Calcium  Carbonate (CALTRATE 600 PO) Take by mouth.    . cetirizine (ZYRTEC) 10 MG tablet Take 10 mg by mouth daily as needed for allergies.    Marland Kitchen denosumab (PROLIA) 60 MG/ML SOLN injection Inject 60 mg into the skin every 6 (six) months. Administer in upper arm, thigh, or abdomen    . fluticasone (FLONASE) 50 MCG/ACT nasal spray Place 2 sprays into both nostrils daily. (Patient taking differently: Place 2 sprays into both nostrils daily as needed. ) 16 g 6  . meclizine (ANTIVERT) 25 MG tablet Take 1 tablet (25 mg total) by mouth 3 (three) times daily as needed for dizziness. 60 tablet 0  . meloxicam (MOBIC) 7.5 MG tablet Take 1 tablet (7.5 mg total) by mouth daily as needed. 30 tablet 2  . omeprazole (PRILOSEC) 20 MG capsule Take 1 capsule (20 mg total) by mouth daily. 30 capsule 5  . valsartan (DIOVAN) 80 MG tablet TAKE 1 TABLET BY MOUTH EVERY DAY 30 tablet 5   No current facility-administered medications on file prior to visit.    BP 112/64 mmHg  Pulse 94  Temp(Src) 98 F (  36.7 C) (Oral)  Resp 16  Ht 5' 5.75" (1.67 m)  Wt 138 lb 6 oz (62.766 kg)  BMI 22.51 kg/m2  SpO2 99%       Objective:   Physical Exam  Constitutional: She is oriented to person, place, and time. She appears well-developed and well-nourished.  HENT:  Head: Normocephalic and atraumatic.  Cardiovascular: Normal rate, regular rhythm and normal heart sounds.   No murmur heard. Pulmonary/Chest: Effort normal and breath sounds normal. No respiratory distress. She has no wheezes.  Musculoskeletal:  + left ankle swelling, no redness.  No pain with ROM of the left ankle.   Neurological: She is alert and oriented to person, place, and time.  Psychiatric: She has a normal mood and affect. Her behavior is normal. Judgment and thought content normal.  Vitals reviewed.         Assessment & Plan:  L ankle pain/swelling- advised ace wrap for compression. Will obtain X ray to rule out fracture.  HTN- BP stable on low dose  diovan. Obtain follow up bmet.

## 2015-05-20 ENCOUNTER — Encounter: Payer: Self-pay | Admitting: Family

## 2015-05-28 ENCOUNTER — Ambulatory Visit: Payer: PRIVATE HEALTH INSURANCE | Admitting: Family

## 2015-05-30 ENCOUNTER — Ambulatory Visit (INDEPENDENT_AMBULATORY_CARE_PROVIDER_SITE_OTHER): Payer: Medicare Other | Admitting: Family

## 2015-05-30 ENCOUNTER — Encounter: Payer: Self-pay | Admitting: Family

## 2015-05-30 VITALS — BP 130/80 | HR 85 | Temp 98.0°F | Resp 16 | Ht 65.75 in | Wt 134.2 lb

## 2015-05-30 DIAGNOSIS — K120 Recurrent oral aphthae: Secondary | ICD-10-CM | POA: Diagnosis not present

## 2015-05-30 NOTE — Progress Notes (Signed)
Subjective:    Patient ID: NATAZIA CANTE, female    DOB: [birth year], 75 y.o.   MRN: [id number]  HPI  Ms. Dudding is a 75 yr old female who presents today to discuss mouth lesions. She reports + ulcer on the tip of her tongue and across the tongue. Started last Thursday. Denies burn to tongue.  She reports that certain foods really irritate her tongue.  Has been doing salt water gargles. Denies hx of oral cold sores but does on occasion develop "canker sores."   Review of Systems    see HPI Past Medical History  Diagnosis Date  . Hypertension   . Hypercalcemia   . Osteoporosis, unspecified   . Mitral valve prolapse     Social History   Social History  . Marital Status: Married    Spouse Name: N/A  . Number of Children: 3  . Years of Education: N/A   Occupational History  . Not on file.   Social History Main Topics  . Smoking status: Never Smoker   . Smokeless tobacco: Never Used  . Alcohol Use: 4.2 oz/week    7 Glasses of wine per week     Comment: daily  . Drug Use: No  . Sexual Activity: Not on file   Other Topics Concern  . Not on file   Social History Narrative   Married   3 daughters- 6 grandchildren   Retired from [employer]   Completed HS   Enjoys walking/reading, sightseeing, travel   Husband has parkinsons.      Past Surgical History  Procedure Laterality Date  . Ganglion cyst excision Right 4 2012    wrist  . Tonsillectomy      Family History  Problem Relation Age of Onset  . Heart disease Mother     CABG in mid 55s  . Cancer Father     liver  . Diabetes Neg Hx   . Kidney disease Neg Hx   . Asthma Neg Hx     Allergies  Allergen Reactions  . Bactrim [Sulfamethoxazole-Trimethoprim]     Patient unsure.  Marland Kitchen Penicillins Hives    Current Outpatient Prescriptions on File Prior to Visit  Medication Sig Dispense Refill  . aspirin EC 81 MG tablet Take 81 mg by mouth daily.    Marland Kitchen BIOTIN PO Take by mouth daily.    . Calcium  Carbonate (CALTRATE 600 PO) Take by mouth.    . cetirizine (ZYRTEC) 10 MG tablet Take 10 mg by mouth daily as needed for allergies.    Marland Kitchen denosumab (PROLIA) 60 MG/ML SOLN injection Inject 60 mg into the skin every 6 (six) months. Administer in upper arm, thigh, or abdomen    . fluticasone (FLONASE) 50 MCG/ACT nasal spray Place 2 sprays into both nostrils daily. (Patient taking differently: Place 2 sprays into both nostrils daily as needed. ) 16 g 6  . meclizine (ANTIVERT) 25 MG tablet Take 1 tablet (25 mg total) by mouth 3 (three) times daily as needed for dizziness. 60 tablet 0  . meloxicam (MOBIC) 7.5 MG tablet Take 1 tablet (7.5 mg total) by mouth daily as needed. 30 tablet 2  . omeprazole (PRILOSEC) 20 MG capsule Take 1 capsule (20 mg total) by mouth daily. 30 capsule 5  . valsartan (DIOVAN) 40 MG tablet Take 1 tablet (40 mg total) by mouth daily. 30 tablet 5   No current facility-administered medications on file prior to visit.    BP 130/80 mmHg  Pulse 85  Temp(Src) 98 F (36.7 C) (Oral)  Resp 16  Ht 5' 5.75" (1.67 m)  Wt 134 lb 3.2 oz (60.873 kg)  BMI 21.83 kg/m2  SpO2 98%    Objective:   Physical Exam  Constitutional: She appears well-developed and well-nourished.  HENT:  Small aphthous ulcer noted on tip of tongue. Some surrounding mild erythema is noted on tip of tongue.   Psychiatric: She has a normal mood and affect. Her behavior is normal. Judgment and thought content normal.          Assessment & Plan:  Apthous ulcer-  Advised pt to continue salt water gargles. Trial of OTC anbesol as needed for pain. Call if symptoms worsen or do not improve.

## 2015-05-30 NOTE — Patient Instructions (Signed)
Start anbesol as needed for pain. Continue salt water gargles.

## 2015-05-31 DIAGNOSIS — D485 Neoplasm of uncertain behavior of skin: Secondary | ICD-10-CM | POA: Diagnosis not present

## 2015-05-31 DIAGNOSIS — L438 Other lichen planus: Secondary | ICD-10-CM | POA: Diagnosis not present

## 2015-06-07 DIAGNOSIS — L0109 Other impetigo: Secondary | ICD-10-CM | POA: Diagnosis not present

## 2015-07-06 ENCOUNTER — Encounter: Payer: Self-pay | Admitting: *Deleted

## 2015-07-09 NOTE — Progress Notes (Signed)
HPI: FU mitral valve prolapse. Previously followed in [facility]. She has had previous carotid Dopplers and stress test; records are not available. Echocardiogram March 2015 showed normal LV function, grade 1 diastolic dysfunction, mild prolapse of the anterior mitral valve leaflet, mild mitral regurgitation, mild right ventricular enlargement and mild tricuspid regurgitation. Since she was last seen, There is no dyspnea, chest pain, palpitations or syncope. She has had chronic neck pain and notes some increase with exertion.  Current Outpatient Prescriptions  Medication Sig Dispense Refill  . aspirin EC 81 MG tablet Take 81 mg by mouth daily.    Marland Kitchen BIOTIN PO Take by mouth daily.    . Calcium Carbonate (CALTRATE 600 PO) Take by mouth.    . cetirizine (ZYRTEC) 10 MG tablet Take 10 mg by mouth daily as needed for allergies.    Marland Kitchen denosumab (PROLIA) 60 MG/ML SOLN injection Inject 60 mg into the skin every 6 (six) months. Administer in upper arm, thigh, or abdomen    . fluticasone (FLONASE) 50 MCG/ACT nasal spray Place 2 sprays into both nostrils daily. (Patient taking differently: Place 2 sprays into both nostrils daily as needed. ) 16 g 6  . meclizine (ANTIVERT) 25 MG tablet Take 1 tablet (25 mg total) by mouth 3 (three) times daily as needed for dizziness. 60 tablet 0  . meloxicam (MOBIC) 7.5 MG tablet Take 1 tablet (7.5 mg total) by mouth daily as needed. 30 tablet 2  . omeprazole (PRILOSEC) 20 MG capsule Take 1 capsule (20 mg total) by mouth daily. 30 capsule 5  . valsartan (DIOVAN) 40 MG tablet Take 1 tablet (40 mg total) by mouth daily. 30 tablet 5   No current facility-administered medications for this visit.     Past Medical History  Diagnosis Date  . Hypertension   . Hypercalcemia   . Osteoporosis, unspecified   . Mitral valve prolapse     Past Surgical History  Procedure Laterality Date  . Ganglion cyst excision Right 4 2012    wrist  . Tonsillectomy       Social History   Social History  . Marital Status: Married    Spouse Name: N/A  . Number of Children: 3  . Years of Education: N/A   Occupational History  . Not on file.   Social History Main Topics  . Smoking status: Never Smoker   . Smokeless tobacco: Never Used  . Alcohol Use: 4.2 oz/week    7 Glasses of wine per week     Comment: daily  . Drug Use: No  . Sexual Activity: Not on file   Other Topics Concern  . Not on file   Social History Narrative   Married   3 daughters- 6 grandchildren   Retired from [employer]   Completed HS   Enjoys walking/reading, sightseeing, travel   Husband has parkinsons.      Family History  Problem Relation Age of Onset  . Heart disease Mother     CABG in mid 70s  . Cancer Father     liver  . Diabetes Neg Hx   . Kidney disease Neg Hx   . Asthma Neg Hx     ROS: no fevers or chills, productive cough, hemoptysis, dysphasia, odynophagia, melena, hematochezia, dysuria, hematuria, rash, seizure activity, orthopnea, PND, pedal edema, claudication. Remaining systems are negative.  Physical Exam: Well-developed well-nourished in no acute distress.  Skin is warm and dry.  HEENT is normal.  Neck is supple.  Chest is clear to auscultation with normal expansion.  Cardiovascular exam is regular rate and rhythm.  Abdominal exam nontender or distended. No masses palpated. Extremities show no edema. neuro grossly intact  ECG Sinus rhythm at a rate of 83. RV conduction delay. Nonspecific ST changes.

## 2015-07-11 ENCOUNTER — Ambulatory Visit (INDEPENDENT_AMBULATORY_CARE_PROVIDER_SITE_OTHER): Payer: Medicare Other | Admitting: Cardiology

## 2015-07-11 ENCOUNTER — Encounter: Payer: Self-pay | Admitting: Cardiology

## 2015-07-11 VITALS — BP 143/82 | HR 83 | Ht 65.75 in | Wt 134.8 lb

## 2015-07-11 DIAGNOSIS — M542 Cervicalgia: Secondary | ICD-10-CM | POA: Diagnosis not present

## 2015-07-11 DIAGNOSIS — I10 Essential (primary) hypertension: Secondary | ICD-10-CM

## 2015-07-11 DIAGNOSIS — I341 Nonrheumatic mitral (valve) prolapse: Secondary | ICD-10-CM | POA: Diagnosis not present

## 2015-07-11 NOTE — Assessment & Plan Note (Signed)
Last echocardiogram showed mild mitral regurgitation.

## 2015-07-11 NOTE — Assessment & Plan Note (Signed)
Patient has had some neck pain that is chronic. Some increase with exertion. I'm not convinced this is cardiac since she feels it is likely musculoskeletal. Consider a stress test in the future if it worsens.

## 2015-07-11 NOTE — Patient Instructions (Signed)
Your physician wants you to follow-up in: ONE YEAR WITH DR CRENSHAW You will receive a reminder letter in the mail two months in advance. If you don't receive a letter, please call our office to schedule the follow-up appointment.   If you need a refill on your cardiac medications before your next appointment, please call your pharmacy.  

## 2015-07-11 NOTE — Assessment & Plan Note (Signed)
Blood pressure is mildly elevated. I have asked her to follow this and we will advance medications as needed.

## 2015-07-24 ENCOUNTER — Encounter: Payer: Self-pay | Admitting: Family

## 2015-07-24 ENCOUNTER — Telehealth: Payer: Self-pay | Admitting: Family

## 2015-07-24 ENCOUNTER — Ambulatory Visit (INDEPENDENT_AMBULATORY_CARE_PROVIDER_SITE_OTHER): Payer: Medicare Other | Admitting: Family

## 2015-07-24 ENCOUNTER — Ambulatory Visit (HOSPITAL_BASED_OUTPATIENT_CLINIC_OR_DEPARTMENT_OTHER)
Admission: RE | Admit: 2015-07-24 | Discharge: 2015-07-24 | Disposition: A | Discharge status: Home / Self Care | Payer: Medicare Other | Source: Ambulatory Visit | Attending: Family | Admitting: Family

## 2015-07-24 VITALS — BP 138/74 | HR 80 | Temp 98.4°F | Resp 16 | Ht 65.75 in | Wt 134.4 lb

## 2015-07-24 DIAGNOSIS — R3129 Other microscopic hematuria: Secondary | ICD-10-CM | POA: Insufficient documentation

## 2015-07-24 DIAGNOSIS — R102 Pelvic and perineal pain: Secondary | ICD-10-CM | POA: Diagnosis not present

## 2015-07-24 DIAGNOSIS — M5137 Other intervertebral disc degeneration, lumbosacral region: Secondary | ICD-10-CM | POA: Insufficient documentation

## 2015-07-24 DIAGNOSIS — R911 Solitary pulmonary nodule: Secondary | ICD-10-CM | POA: Diagnosis not present

## 2015-07-24 DIAGNOSIS — R319 Hematuria, unspecified: Secondary | ICD-10-CM

## 2015-07-24 LAB — POCT URINALYSIS DIPSTICK
BILIRUBIN UA: NORMAL
GLUCOSE UA: NORMAL
KETONES UA: NORMAL
Leukocytes, UA: NEGATIVE
Nitrite, UA: NORMAL
PH UA: 6
Protein, UA: NORMAL
SPEC GRAV UA: 1.025
Urobilinogen, UA: NEGATIVE

## 2015-07-24 MED ORDER — CIPROFLOXACIN HCL 250 MG PO TABS: 250.0000 mg | ORAL_TABLET | Freq: Two times a day (BID) | ORAL | Status: DC

## 2015-07-24 NOTE — Telephone Encounter (Signed)
Please let pt know that I reviewed her CT scan.  Neg for stone- it does show a cyst in her left kidney . Possible she could have already passed a stone.  I would like her to complete cipro, let me know if she is not feeling better in a few days.  I would like her to meet with urology for further evaluation.

## 2015-07-24 NOTE — Progress Notes (Signed)
Subjective:    Patient ID: ANEVAEH GANGI, female    DOB: 1941-01-21, 75 y.o.   MRN: [id number]  HPI  Ms. Rafanan is a 75 yr old female who presents today with chief complaint of right sided low back pain and burning pain in the right lower pelvis. Reports + urinary frequency, malaise. Denies dysuria.  She denies fever.  +sleep.  Had some nausea on 5/28. She reports + belching. No vomiting or diarrhea. Tolerating PO's.   Review of Systems See HPI  Past Medical History  Diagnosis Date  . Hypertension   . Hypercalcemia   . Osteoporosis, unspecified   . Mitral valve prolapse      Social History   Social History  . Marital Status: Married    Spouse Name: N/A  . Number of Children: 3  . Years of Education: N/A   Occupational History  . Not on file.   Social History Main Topics  . Smoking status: Never Smoker   . Smokeless tobacco: Never Used  . Alcohol Use: 4.2 oz/week    7 Glasses of wine per week     Comment: daily  . Drug Use: No  . Sexual Activity: Not on file   Other Topics Concern  . Not on file   Social History Narrative   Married   3 daughters- 6 grandchildren   Retired from [employer]   Completed HS   Enjoys walking/reading, sightseeing, travel   Husband has parkinsons.      Past Surgical History  Procedure Laterality Date  . Ganglion cyst excision Right 4 2012    wrist  . Tonsillectomy      Family History  Problem Relation Age of Onset  . Heart disease Mother     CABG in mid 56s  . Cancer Father     liver  . Diabetes Neg Hx   . Kidney disease Neg Hx   . Asthma Neg Hx     Allergies  Allergen Reactions  . Bactrim [Sulfamethoxazole-Trimethoprim]     Patient unsure.  Marland Kitchen Penicillins Hives    Current Outpatient Prescriptions on File Prior to Visit  Medication Sig Dispense Refill  . aspirin EC 81 MG tablet Take 81 mg by mouth daily.    Marland Kitchen BIOTIN PO Take by mouth daily.    . Calcium Carbonate (CALTRATE 600 PO) Take by mouth.     . cetirizine (ZYRTEC) 10 MG tablet Take 10 mg by mouth daily as needed for allergies.    Marland Kitchen denosumab (PROLIA) 60 MG/ML SOLN injection Inject 60 mg into the skin every 6 (six) months. Administer in upper arm, thigh, or abdomen    . fluticasone (FLONASE) 50 MCG/ACT nasal spray Place 2 sprays into both nostrils daily. (Patient taking differently: Place 2 sprays into both nostrils daily as needed. ) 16 g 6  . meclizine (ANTIVERT) 25 MG tablet Take 1 tablet (25 mg total) by mouth 3 (three) times daily as needed for dizziness. 60 tablet 0  . meloxicam (MOBIC) 7.5 MG tablet Take 1 tablet (7.5 mg total) by mouth daily as needed. 30 tablet 2  . omeprazole (PRILOSEC) 20 MG capsule Take 1 capsule (20 mg total) by mouth daily. 30 capsule 5  . valsartan (DIOVAN) 40 MG tablet Take 1 tablet (40 mg total) by mouth daily. 30 tablet 5   No current facility-administered medications on file prior to visit.    BP 138/74 mmHg  Pulse 80  Temp(Src) 98.4 F (36.9 C) (Oral)  Resp 16  Ht 5' 5.75" (1.67 m)  Wt 134 lb 6.4 oz (60.963 kg)  BMI 21.86 kg/m2  SpO2 100%       Objective:   Physical Exam  Constitutional: She is oriented to person, place, and time. She appears well-developed and well-nourished.  HENT:  Head: Normocephalic and atraumatic.  Cardiovascular: Normal rate, regular rhythm and normal heart sounds.   No murmur heard. Pulmonary/Chest: Effort normal and breath sounds normal. No respiratory distress. She has no wheezes.  Abdominal: There is no CVA tenderness.  Musculoskeletal: She exhibits no edema.  Neurological: She is alert and oriented to person, place, and time.  Psychiatric: She has a normal mood and affect. Her behavior is normal. Judgment and thought content normal.          Assessment & Plan:  Pelvic pain/Back pain/hematuria- obtain CT scan to further evaluate and rule out stone. Start empiric Cipro, send urine for culture.

## 2015-07-24 NOTE — Telephone Encounter (Signed)
Notified pt and she voices understanding. Is agreeable to proceed with urology referral.

## 2015-07-24 NOTE — Progress Notes (Signed)
Pre visit review using our clinic review tool, if applicable. No additional management support is needed unless otherwise documented below in the visit note. 

## 2015-07-24 NOTE — Patient Instructions (Signed)
Please begin Cipro for possible UTI. Complete CT scan on the first floor. Call if symptoms worsen, if you develop fever, or if symptoms are not improved in 2-3 days.

## 2015-07-25 ENCOUNTER — Telehealth: Payer: Self-pay | Admitting: Family

## 2015-07-25 LAB — URINE CULTURE: Colony Count: 4000

## 2015-07-25 NOTE — Telephone Encounter (Signed)
error:[id number] ° °

## 2015-07-26 ENCOUNTER — Encounter: Payer: Self-pay | Admitting: Family

## 2015-07-26 DIAGNOSIS — R911 Solitary pulmonary nodule: Secondary | ICD-10-CM

## 2015-08-01 ENCOUNTER — Ambulatory Visit (INDEPENDENT_AMBULATORY_CARE_PROVIDER_SITE_OTHER): Payer: Medicare Other | Admitting: Family

## 2015-08-01 ENCOUNTER — Encounter: Payer: Self-pay | Admitting: Family

## 2015-08-01 VITALS — BP 126/73 | HR 89 | Temp 97.9°F | Resp 16 | Ht 65.75 in | Wt 135.8 lb

## 2015-08-01 DIAGNOSIS — R911 Solitary pulmonary nodule: Secondary | ICD-10-CM | POA: Diagnosis not present

## 2015-08-01 DIAGNOSIS — M25551 Pain in right hip: Secondary | ICD-10-CM | POA: Diagnosis not present

## 2015-08-01 DIAGNOSIS — R3129 Other microscopic hematuria: Secondary | ICD-10-CM | POA: Insufficient documentation

## 2015-08-01 NOTE — Patient Instructions (Signed)
Please keep your upcoming appointment with Urology. We will request the records from you pulmonologist for comparison.

## 2015-08-01 NOTE — Progress Notes (Signed)
Pre visit review using our clinic review tool, if applicable. No additional management support is needed unless otherwise documented below in the visit note. 

## 2015-08-01 NOTE — Assessment & Plan Note (Signed)
Will request records from her previous pulmonologist for comparison. She wishes to hold off on CT chest until we have had a chance to review these records.

## 2015-08-01 NOTE — Assessment & Plan Note (Signed)
Pt is scheduled for urology consult for further evaluation.

## 2015-08-01 NOTE — Telephone Encounter (Signed)
Pt states she saw Thedora Hinders in [place] for 7 years for a lung nodule that remained stable and she was released from his care after that time. Records release signed by pt and faxed to [phone]. Awaiting records.

## 2015-08-01 NOTE — Progress Notes (Signed)
Subjective:    Patient ID: Mary Stevens, female    DOB: May 28, 1940, 75 y.o.   MRN: [id number]  HPI  Ms. Mary Stevens is a 75 yr old female who presents today for follow up of her pelvic pain.  Last week she described right sided lower pelvic pain. Had associated urinary frequency, malaise.  She was noted to have 3+ blood on urinalysis.  This prompted CT abd. No renal calculi were noted.  She was noted to have a 68mm pleural-based nodule in the LLL.    CT of the chest is scheduled for further evaluation of the lung nodule.  Referral was placed to urology for evaluation of hematuria.  Urine culture was negative for infection. She reports resolution of her pelvic pain and now thinks that it may have been more of a referred hip pain because a few hours later she developed right hip pain- this too has since resolved.  She reports + hx of pulmonary nodule which was followed over a period of 7 years with her previous provider.     Review of Systems See HPI  Past Medical History  Diagnosis Date  . Hypertension   . Hypercalcemia   . Osteoporosis, unspecified   . Mitral valve prolapse      Social History   Social History  . Marital Status: Married    Spouse Name: N/A  . Number of Children: 3  . Years of Education: N/A   Occupational History  . Not on file.   Social History Main Topics  . Smoking status: Never Smoker   . Smokeless tobacco: Never Used  . Alcohol Use: 4.2 oz/week    7 Glasses of wine per week     Comment: daily  . Drug Use: No  . Sexual Activity: Not on file   Other Topics Concern  . Not on file   Social History Narrative   Married   3 daughters- 6 grandchildren   Retired from [employer]   Completed HS   Enjoys walking/reading, sightseeing, travel   Husband has parkinsons.      Past Surgical History  Procedure Laterality Date  . Ganglion cyst excision Right 4 2012    wrist  . Tonsillectomy      Family History  Problem Relation Age of  Onset  . Heart disease Mother     CABG in mid 55s  . Cancer Father     liver  . Diabetes Neg Hx   . Kidney disease Neg Hx   . Asthma Neg Hx     Allergies  Allergen Reactions  . Bactrim [Sulfamethoxazole-Trimethoprim]     Patient unsure.  Marland Kitchen Penicillins Hives    Current Outpatient Prescriptions on File Prior to Visit  Medication Sig Dispense Refill  . aspirin EC 81 MG tablet Take 81 mg by mouth daily.    Marland Kitchen BIOTIN PO Take by mouth daily.    . Calcium Carbonate (CALTRATE 600 PO) Take by mouth.    . cetirizine (ZYRTEC) 10 MG tablet Take 10 mg by mouth daily as needed for allergies.    Marland Kitchen denosumab (PROLIA) 60 MG/ML SOLN injection Inject 60 mg into the skin every 6 (six) months. Administer in upper arm, thigh, or abdomen    . fluticasone (FLONASE) 50 MCG/ACT nasal spray Place 2 sprays into both nostrils daily. (Patient taking differently: Place 2 sprays into both nostrils daily as needed. ) 16 g 6  . meclizine (ANTIVERT) 25 MG tablet Take 1 tablet (25 mg  total) by mouth 3 (three) times daily as needed for dizziness. 60 tablet 0  . meloxicam (MOBIC) 7.5 MG tablet Take 1 tablet (7.5 mg total) by mouth daily as needed. 30 tablet 2  . omeprazole (PRILOSEC) 20 MG capsule Take 1 capsule (20 mg total) by mouth daily. 30 capsule 5  . valsartan (DIOVAN) 40 MG tablet Take 1 tablet (40 mg total) by mouth daily. 30 tablet 5   No current facility-administered medications on file prior to visit.    BP 126/73 mmHg  Pulse 89  Temp(Src) 97.9 F (36.6 C) (Oral)  Resp 16  Ht 5' 5.75" (1.67 m)  Wt 135 lb 12.8 oz (61.598 kg)  BMI 22.09 kg/m2  SpO2 100%       Objective:   Physical Exam  Constitutional: She is oriented to person, place, and time. She appears well-developed and well-nourished.  Cardiovascular: Normal rate, regular rhythm and normal heart sounds.   No murmur heard. Pulmonary/Chest: Effort normal and breath sounds normal. No respiratory distress. She has no wheezes.    Musculoskeletal: She exhibits no edema.  Neurological: She is alert and oriented to person, place, and time.  Psychiatric: She has a normal mood and affect. Her behavior is normal. Judgment and thought content normal.          Assessment & Plan:  R joint/pelvic pain- resolved.  Monitor.

## 2015-08-10 DIAGNOSIS — H5203 Hypermetropia, bilateral: Secondary | ICD-10-CM | POA: Diagnosis not present

## 2015-08-10 DIAGNOSIS — H43393 Other vitreous opacities, bilateral: Secondary | ICD-10-CM | POA: Diagnosis not present

## 2015-08-10 DIAGNOSIS — H04123 Dry eye syndrome of bilateral lacrimal glands: Secondary | ICD-10-CM | POA: Diagnosis not present

## 2015-08-10 DIAGNOSIS — H2513 Age-related nuclear cataract, bilateral: Secondary | ICD-10-CM | POA: Diagnosis not present

## 2015-08-13 ENCOUNTER — Other Ambulatory Visit: Payer: Self-pay | Admitting: Urology

## 2015-08-13 DIAGNOSIS — N362 Urethral caruncle: Secondary | ICD-10-CM | POA: Diagnosis not present

## 2015-08-13 DIAGNOSIS — C642 Malignant neoplasm of left kidney, except renal pelvis: Secondary | ICD-10-CM

## 2015-08-13 DIAGNOSIS — R3129 Other microscopic hematuria: Secondary | ICD-10-CM | POA: Diagnosis not present

## 2015-08-27 ENCOUNTER — Ambulatory Visit (HOSPITAL_COMMUNITY)
Admission: RE | Admit: 2015-08-27 | Discharge: 2015-08-27 | Disposition: A | Discharge status: Home / Self Care | Payer: Medicare Other | Source: Ambulatory Visit | Attending: Urology | Admitting: Urology

## 2015-08-27 DIAGNOSIS — C642 Malignant neoplasm of left kidney, except renal pelvis: Secondary | ICD-10-CM | POA: Diagnosis not present

## 2015-08-27 DIAGNOSIS — N289 Disorder of kidney and ureter, unspecified: Secondary | ICD-10-CM | POA: Insufficient documentation

## 2015-08-27 DIAGNOSIS — N281 Cyst of kidney, acquired: Secondary | ICD-10-CM | POA: Diagnosis not present

## 2015-08-27 LAB — POCT I-STAT CREATININE: Creatinine, Ser: 0.8 mg/dL (ref 0.44–1.00)

## 2015-08-27 MED ORDER — GADOBENATE DIMEGLUMINE 529 MG/ML IV SOLN
15.0000 mL | Freq: Once | INTRAVENOUS | Status: AC | PRN
  Administered 2015-08-27: 13 mL via INTRAVENOUS

## 2015-08-31 ENCOUNTER — Encounter: Payer: Self-pay | Admitting: Family

## 2015-08-31 ENCOUNTER — Ambulatory Visit (INDEPENDENT_AMBULATORY_CARE_PROVIDER_SITE_OTHER): Payer: Medicare Other | Admitting: Family

## 2015-08-31 ENCOUNTER — Telehealth: Payer: Self-pay | Admitting: Family

## 2015-08-31 VITALS — BP 110/82 | HR 78 | Temp 98.4°F | Resp 16 | Ht 66.0 in | Wt 135.0 lb

## 2015-08-31 DIAGNOSIS — I10 Essential (primary) hypertension: Secondary | ICD-10-CM

## 2015-08-31 DIAGNOSIS — H8113 Benign paroxysmal vertigo, bilateral: Secondary | ICD-10-CM

## 2015-08-31 DIAGNOSIS — M810 Age-related osteoporosis without current pathological fracture: Secondary | ICD-10-CM

## 2015-08-31 DIAGNOSIS — Z1231 Encounter for screening mammogram for malignant neoplasm of breast: Secondary | ICD-10-CM | POA: Diagnosis not present

## 2015-08-31 DIAGNOSIS — I341 Nonrheumatic mitral (valve) prolapse: Secondary | ICD-10-CM

## 2015-08-31 DIAGNOSIS — Z0000 Encounter for general adult medical examination without abnormal findings: Secondary | ICD-10-CM | POA: Diagnosis not present

## 2015-08-31 DIAGNOSIS — Z1239 Encounter for other screening for malignant neoplasm of breast: Secondary | ICD-10-CM

## 2015-08-31 DIAGNOSIS — R911 Solitary pulmonary nodule: Secondary | ICD-10-CM | POA: Diagnosis not present

## 2015-08-31 DIAGNOSIS — R3129 Other microscopic hematuria: Secondary | ICD-10-CM

## 2015-08-31 LAB — BASIC METABOLIC PANEL
BUN: 19 mg/dL (ref 6–23)
CO2: 32 mEq/L (ref 19–32)
CREATININE: 0.88 mg/dL (ref 0.40–1.20)
Calcium: 11.1 mg/dL — ABNORMAL HIGH (ref 8.4–10.5)
Chloride: 104 mEq/L (ref 96–112)
GFR: 66.75 mL/min (ref >60.00)
Glucose, Bld: 85 mg/dL (ref 70–99)
POTASSIUM: 3.8 meq/L (ref 3.5–5.1)
Sodium: 141 mEq/L (ref 135–145)

## 2015-08-31 NOTE — Assessment & Plan Note (Signed)
Most recent dexa was improved. Given recent dental hx, I think it is reasonable to have patient remain off of prolia. Will plan a 2 year follow up dexa. If number deteriorate can weigh risk/benefit of restarting prolia.

## 2015-08-31 NOTE — Progress Notes (Signed)
Pre visit review using our clinic review tool, if applicable. No additional management support is needed unless otherwise documented below in the visit note. 

## 2015-08-31 NOTE — Telephone Encounter (Signed)
Calcium is elevated. Please stop calcium supplement. Follow up in 2 weeks for labs pended.

## 2015-08-31 NOTE — Telephone Encounter (Signed)
Spoke with Dr Donne Anon office. They have no record of receiving previous records release. Verified fax and was told fax # is now [phone].  New Form completed and faxed to new #.

## 2015-08-31 NOTE — Patient Instructions (Signed)
Please complete lab work prior to leaving. OK to remain off of prolia for now. Follow up in 6 months.

## 2015-08-31 NOTE — Assessment & Plan Note (Signed)
Mild per last echo 2015, asymptomatic, following with cardiology.

## 2015-08-31 NOTE — Telephone Encounter (Signed)
Medical release faxed to above fax #.

## 2015-08-31 NOTE — Telephone Encounter (Signed)
Spoke w/ Pt, informed her of lab results and to stop taking calcium supplements and to come by office to recheck in 2 weeks. Lab appt scheduled for 09/10/2015 at 0945. PTH, Intact and Calcium ordered.

## 2015-08-31 NOTE — Assessment & Plan Note (Signed)
Symptoms are resolved.

## 2015-08-31 NOTE — Progress Notes (Signed)
Subjective:    Mary Stevens is a 75 y.o. female who presents for Medicare Annual/Subsequent preventive examination.  Preventive Screening-Counseling & Management  Tobacco History  Smoking status  . Never Smoker   Smokeless tobacco  . Never Used     Problems Prior to Visit 1.  Pulmonary Nodule- will re-request pulm info, we did not receive info.    2.  HTN- maintained on diovan.  BP Readings from Last 3 Encounters:  08/31/15 110/82  08/01/15 126/73  07/24/15 138/74   3. MVP-note of mild mitral valve prolapse.  Denies SOB or swelling. She is following with Dr. Stanford Breed.   4.  Osteoporosis- was on prolia, discontinued due to concern that it was not helping and due to recent dental issues. Reports recurrent abscesses in the roots of her teeth.  Reports that it took 4 months for her infection to clear.   5.  Microscopic hematuria- saw Urology, Dr. Alyson Ingles in June and was scheduled for MRI of the kidneys as well as cystoscopy. She completed MRI on Monday and has not heard back. She follows up on 7/20 for cystoscopy.  She was given rx for estrogen cream for topical application to the urethra.  6.  BPH- Denies recent vertigo symptoms.   7.  Cough- continues zyrtec and omeprazole.  Has intermittent cough. She is on diovan.  Preventative care-  Patient presents today for complete physical.  Immunizations: up to date Diet: healthy Exercise: walking Colonoscopy: 2010- was told 1 or 2 polyps. Will call us with name of GI so that we can request records for review. Dexa: 2016 Pap Smear:  Not needed Mammogram: 5/16   Current Problems (verified) Patient Active Problem List   Diagnosis Date Noted  . Solitary pulmonary nodule 08/01/2015  . Microscopic hematuria 08/01/2015  . Neck pain 07/11/2015  . Caregiver stress 11/09/2014  . Benign paroxysmal positional vertigo 10/03/2014  . Cough 03/20/2014  . Right shoulder pain 12/26/2013  . Mitral valve prolapse 05/04/2013  .  Hypercalcemia 01/17/2013  . Osteoporosis 11/08/2012  . HTN (hypertension) 04/07/2012    Medications Prior to Visit Current Outpatient Prescriptions on File Prior to Visit  Medication Sig Dispense Refill  . aspirin EC 81 MG tablet Take 81 mg by mouth daily.    Marland Kitchen BIOTIN PO Take by mouth daily.    . Calcium Carbonate (CALTRATE 600 PO) Take by mouth.    . cetirizine (ZYRTEC) 10 MG tablet Take 10 mg by mouth daily as needed for allergies.    . fluticasone (FLONASE) 50 MCG/ACT nasal spray Place 2 sprays into both nostrils daily. (Patient taking differently: Place 2 sprays into both nostrils daily as needed. ) 16 g 6  . meclizine (ANTIVERT) 25 MG tablet Take 1 tablet (25 mg total) by mouth 3 (three) times daily as needed for dizziness. 60 tablet 0  . meloxicam (MOBIC) 7.5 MG tablet Take 1 tablet (7.5 mg total) by mouth daily as needed. 30 tablet 2  . omeprazole (PRILOSEC) 20 MG capsule Take 1 capsule (20 mg total) by mouth daily. 30 capsule 5  . valsartan (DIOVAN) 40 MG tablet Take 1 tablet (40 mg total) by mouth daily. 30 tablet 5   No current facility-administered medications on file prior to visit.    Current Medications (verified) Current Outpatient Prescriptions  Medication Sig Dispense Refill  . aspirin EC 81 MG tablet Take 81 mg by mouth daily.    Marland Kitchen BIOTIN PO Take by mouth daily.    . Calcium  Carbonate (CALTRATE 600 PO) Take by mouth.    . cetirizine (ZYRTEC) 10 MG tablet Take 10 mg by mouth daily as needed for allergies.    . fluticasone (FLONASE) 50 MCG/ACT nasal spray Place 2 sprays into both nostrils daily. (Patient taking differently: Place 2 sprays into both nostrils daily as needed. ) 16 g 6  . meclizine (ANTIVERT) 25 MG tablet Take 1 tablet (25 mg total) by mouth 3 (three) times daily as needed for dizziness. 60 tablet 0  . meloxicam (MOBIC) 7.5 MG tablet Take 1 tablet (7.5 mg total) by mouth daily as needed. 30 tablet 2  . omeprazole (PRILOSEC) 20 MG capsule Take 1 capsule (20  mg total) by mouth daily. 30 capsule 5  . valsartan (DIOVAN) 40 MG tablet Take 1 tablet (40 mg total) by mouth daily. 30 tablet 5   No current facility-administered medications for this visit.     Allergies (verified) Bactrim and Penicillins   PAST HISTORY  Family History Family History  Problem Relation Age of Onset  . Heart disease Mother     CABG in mid 13s  . Cancer Father     liver  . Diabetes Neg Hx   . Kidney disease Neg Hx   . Asthma Neg Hx     Social History Social History  Substance Use Topics  . Smoking status: Never Smoker   . Smokeless tobacco: Never Used  . Alcohol Use: 4.2 oz/week    7 Glasses of wine per week     Comment: daily     Are there smokers in your home (other than you)? No  Risk Factors Current exercise habits: walks regularly  Dietary issues discussed: healthy diet   Cardiac risk factors: advanced age (older than 22 for men, 79 for women) and hypertension.  Depression Screen (Note: if answer to either of the following is "Yes", a more complete depression screening is indicated)   Over the past two weeks, have you felt down, depressed or hopeless? No  Over the past two weeks, have you felt little interest or pleasure in doing things? No  Have you lost interest or pleasure in daily life? No  Do you often feel hopeless? No  Do you cry easily over simple problems? No  Activities of Daily Living In your present state of health, do you have any difficulty performing the following activities?:  Driving? No Managing money?  No Feeding yourself? No Getting from bed to chair? No . Climbing a flight of stairs? No Preparing food and eating?: No Bathing or showering? No Getting dressed: No Getting to the toilet? No Using the toilet:No Moving around from place to place: No In the past year have you fallen or had a near fall?:No   Are you sexually active?  Yes  Do you have more than one partner?  No  Hearing Difficulties: No Do you often  ask people to speak up or repeat themselves? No Do you experience ringing or noises in your ears? No Do you have difficulty understanding soft or whispered voices? No   Do you feel that you have a problem with memory? No  Do you often misplace items? No  Do you feel safe at home?  Yes  Cognitive Testing  Alert? Yes  Normal Appearance?Yes  Oriented to person? Yes  Place? Yes   Time? Yes  Recall of three objects?  Yes  Can perform simple calculations? Yes  Displays appropriate judgment?Yes  Can read the correct time from a  watch face?Yes   Advanced Directives have been discussed with the patient? Yes  List the Names of Other Physician/Practitioners you currently use: 1.  See provider list under snapshot  Indicate any recent Medical Services you may have received from other than [facility] providers in the past year (date may be approximate).  Immunization History  Administered Date(s) Administered  . Influenza Split 11/25/2011  . Influenza, High Dose Seasonal PF 11/14/2013  . Influenza,inj,Quad PF,36+ Mos 11/08/2012, 11/07/2014  . Pneumococcal Conjugate-13 02/07/2013  . Pneumococcal Polysaccharide-23 04/07/2009  . Td 05/29/2014  . Zoster 04/25/2011    Screening Tests Health Maintenance  Topic Date Due  . INFLUENZA VACCINE  09/25/2015  . MAMMOGRAM  07/16/2016  . COLONOSCOPY  02/24/2018  . TETANUS/TDAP  05/28/2024  . DEXA SCAN  Completed  . ZOSTAVAX  Completed  . PNA vac Low Risk Adult  Completed    All answers were reviewed with the patient and necessary referrals were made:  O'SULLIVAN,Elizzie Westergard S., NP   08/31/2015   History reviewed: allergies, current medications, past family history, past medical history, past social history, past surgical history and problem list  Review of Systems Pertinent items are noted in HPI.    Objective:   Declined vision screen  Body mass index is 21.8 kg/(m^2). BP 110/82 mmHg  Pulse 78  Temp(Src) 98.4 F (36.9 C) (Oral)  Resp 16  Ht  5\' 6"  (1.676 m)  Wt 135 lb (61.236 kg)  BMI 21.80 kg/m2  SpO2 98%     Physical Exam  Constitutional: She is oriented to person, place, and time. She appears well-developed and well-nourished. No distress.  HENT:  Head: Normocephalic and atraumatic.  Right Ear: Tympanic membrane and ear canal normal.  Left Ear: Tympanic membrane and ear canal normal.  Mouth/Throat: Oropharynx is clear and moist.  Eyes: Pupils are equal, round, and reactive to light. No scleral icterus.  Neck: Normal range of motion. No thyromegaly present.  Cardiovascular: Normal rate and regular rhythm.   No murmur heard. Pulmonary/Chest: Effort normal and breath sounds normal. No respiratory distress. He has no wheezes. She has no rales. She exhibits no tenderness.  Abdominal: Soft. Bowel sounds are normal. He exhibits no distension and no mass. There is no tenderness. There is no rebound and no guarding.  Musculoskeletal: She exhibits no edema.  Lymphadenopathy:    She has no cervical adenopathy.  Neurological: She is alert and oriented to person, place, and time. She has normal reflexes. She exhibits normal muscle tone. Coordination normal.  Skin: Skin is warm and dry.  Psychiatric: She has a normal mood and affect. Her behavior is normal. Judgment and thought content normal.  Breasts: Examined lying Right: Without masses, retractions, discharge or axillary adenopathy.  Left: Without masses, retractions, discharge or axillary adenopathy.  Pelvic: deferred.            Assessment & Plan:      Assessment:          Plan:     During the course of the visit the patient was educated and counseled about appropriate screening and preventive services including:    Influenza vaccine  continue healthy diet/exercise. Mammogram due- ordered Will request colo results (she will send me name of previous GI) so we can determine if she is due for colo prior to 10 years.  Diet review for nutrition referral?  Yes ____  Not Indicated _x___   Patient Instructions (the written plan) was given to the patient.  Medicare Attestation I  have personally reviewed: The patient's medical and social history Their use of alcohol, tobacco or illicit drugs Their current medications and supplements The patient's functional ability including ADLs,fall risks, home safety risks, cognitive, and hearing and visual impairment Diet and physical activities Evidence for depression or mood disorders  The patient's weight, height, BMI, and visual acuity have been recorded in the chart.  I have made referrals, counseling, and provided education to the patient based on review of the above and I have provided the patient with a written personalized care plan for preventive services.     O'SULLIVAN,Dimitris Shanahan S., NP   08/31/2015

## 2015-08-31 NOTE — Assessment & Plan Note (Signed)
BP is stable on current medication. Continue same, obtain follow up bmet.

## 2015-08-31 NOTE — Assessment & Plan Note (Signed)
Reviewed urology note, work up on going.

## 2015-08-31 NOTE — Assessment & Plan Note (Signed)
Will re-request records from prior pulmonologist, apparently they changed their fax # so did not receive our request.

## 2015-09-04 ENCOUNTER — Other Ambulatory Visit: Payer: Self-pay | Admitting: Family

## 2015-09-04 ENCOUNTER — Ambulatory Visit (HOSPITAL_BASED_OUTPATIENT_CLINIC_OR_DEPARTMENT_OTHER)
Admission: RE | Admit: 2015-09-04 | Discharge: 2015-09-04 | Disposition: A | Discharge status: Home / Self Care | Payer: Medicare Other | Source: Ambulatory Visit | Attending: Family | Admitting: Family

## 2015-09-04 DIAGNOSIS — Z1239 Encounter for other screening for malignant neoplasm of breast: Secondary | ICD-10-CM

## 2015-09-04 DIAGNOSIS — Z1231 Encounter for screening mammogram for malignant neoplasm of breast: Secondary | ICD-10-CM | POA: Diagnosis not present

## 2015-09-04 MED ORDER — AMLODIPINE BESYLATE 5 MG PO TABS: 5.0000 mg | ORAL_TABLET | Freq: Every day | ORAL | Status: DC

## 2015-09-04 NOTE — Telephone Encounter (Signed)
Pt came in for husband's appointment and wants to try to com off of ARB due to cough. Will give trial of amlodipine. Advised pt to come in for a nurse visit when she returns for blood work so we can repeat her BP.

## 2015-09-05 ENCOUNTER — Telehealth: Payer: Self-pay | Admitting: *Deleted

## 2015-09-05 NOTE — Telephone Encounter (Signed)
Forwarded to Melissa. JG//CMA  

## 2015-09-10 ENCOUNTER — Other Ambulatory Visit: Payer: Medicare Other

## 2015-09-11 ENCOUNTER — Ambulatory Visit (INDEPENDENT_AMBULATORY_CARE_PROVIDER_SITE_OTHER): Payer: Medicare Other | Admitting: Physician Assistant

## 2015-09-11 ENCOUNTER — Other Ambulatory Visit (INDEPENDENT_AMBULATORY_CARE_PROVIDER_SITE_OTHER): Payer: Medicare Other

## 2015-09-11 VITALS — BP 126/82 | HR 77

## 2015-09-11 DIAGNOSIS — I10 Essential (primary) hypertension: Secondary | ICD-10-CM | POA: Diagnosis not present

## 2015-09-11 NOTE — Progress Notes (Signed)
Plan is as noted by RN. FU with PCP as scheduled.

## 2015-09-11 NOTE — Progress Notes (Signed)
Pre visit review using our clinic review tool, if applicable. No additional management support is needed unless otherwise documented below in the visit note.  Per 08/31/15 MyChart message documentation: Pt came in for husband's appointment and wants to try to com off of ARB due to cough. Will give trial of amlodipine. Advised pt to come in for a nurse visit when she returns for blood work so we can repeat her BP.   Pt tolerating amlodipine well, no adverse effects reported.   Per Elyn Aquas, PA-C: Continue current medications. Follow-up w/ Melissa as recommended.  Dorrene German, RN

## 2015-09-12 ENCOUNTER — Encounter: Payer: Self-pay | Admitting: Family

## 2015-09-12 LAB — PTH, INTACT AND CALCIUM
CALCIUM: 9.9 mg/dL (ref 8.4–10.5)
PTH: 34 pg/mL (ref 14–64)

## 2015-09-13 ENCOUNTER — Other Ambulatory Visit: Payer: Self-pay | Admitting: Family

## 2015-09-13 DIAGNOSIS — N362 Urethral caruncle: Secondary | ICD-10-CM | POA: Diagnosis not present

## 2015-09-13 DIAGNOSIS — R3129 Other microscopic hematuria: Secondary | ICD-10-CM | POA: Diagnosis not present

## 2015-10-01 NOTE — Telephone Encounter (Signed)
Pt in office today and wanted to check status of records release to Dr Verdie Mosher (pulmonologist). Called Dr Donne Anon office and verified that request was received 08/31/15 and forwarded to [employer] to process release. Called [phone] and spoke with Renee. She states nothing has been released yet and records could still be in the system uploading for them to release and to call them back at the end of the week. Notified pt.

## 2015-10-30 ENCOUNTER — Encounter: Payer: Self-pay | Admitting: Podiatry

## 2015-10-30 ENCOUNTER — Ambulatory Visit (INDEPENDENT_AMBULATORY_CARE_PROVIDER_SITE_OTHER): Payer: Medicare Other | Admitting: Podiatry

## 2015-10-30 ENCOUNTER — Ambulatory Visit (INDEPENDENT_AMBULATORY_CARE_PROVIDER_SITE_OTHER): Payer: Medicare Other

## 2015-10-30 DIAGNOSIS — M79672 Pain in left foot: Secondary | ICD-10-CM | POA: Diagnosis not present

## 2015-10-30 DIAGNOSIS — S93402A Sprain of unspecified ligament of left ankle, initial encounter: Secondary | ICD-10-CM

## 2015-10-30 DIAGNOSIS — M779 Enthesopathy, unspecified: Secondary | ICD-10-CM

## 2015-10-30 NOTE — Progress Notes (Signed)
   Subjective:    Patient ID: Mary Stevens, female    DOB: 1941/02/20, 75 y.o.   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 She did see her doctor who x-rayed the ankle and foot which was negative for fracture. She states that is still considerably swollen but the pain is much better.    Review of Systems  All other systems reviewed and are negative.      Objective:   Physical Exam: Vital signs are stable she is alert and oriented 3 pulses are palpable. Neurologic sensorium is intact. Deep tendon reflexes are intact. Muscle strength is normal bilateral. Orthopedic evaluation demonstrates no anterior drawer to the ankle that she does have pain on palpation to the anterolateral ligaments. Particularly the anterior talofibular ligament she also has mild swelling distal to the ankle and on the foot. She ambulates with a completely normal gait Radiographs taken of the ankle and foot today do not demonstrate any type of fractures. No other tendons or joints are supple were or are tender on palpation. Cutaneous evaluation does not demonstrate any type of open wound or lesion.      Assessment & Plan:  Anterolateral ankle sprain.  Plan: Discussed etiology pathology conservative versus surgical therapies. Instructed her to wear shoe gear at all times and I placed her in a compression anklet to help with the swelling. If this does not resolve in the next few months then we will consider an MRI of anterolateral ankle to make sure there are no significant tears.

## 2015-11-06 NOTE — Telephone Encounter (Signed)
Called below number to check on status of records release and received message that their offices are closed due to inclement weather. Will try later this week.

## 2015-11-07 ENCOUNTER — Telehealth: Payer: Self-pay | Admitting: Family

## 2015-11-07 NOTE — Telephone Encounter (Signed)
Sent mychart message to pt to verify.

## 2015-11-07 NOTE — Telephone Encounter (Signed)
Patient last received Prolia 06/02/14, please advise if you would like Patient to continue with Prolia or if you have started another treatment?   Just and FYI patient must have DX of osteoporosis, osteopenia is no longer a covered DX for Prolia.   Please advise.

## 2015-11-08 NOTE — Telephone Encounter (Signed)
Yes, she should continue prolia please. She does have diagnosis of osteopenia.

## 2015-11-12 ENCOUNTER — Ambulatory Visit (INDEPENDENT_AMBULATORY_CARE_PROVIDER_SITE_OTHER): Payer: Medicare Other | Admitting: Family

## 2015-11-12 ENCOUNTER — Encounter: Payer: Self-pay | Admitting: Family

## 2015-11-12 VITALS — BP 130/71 | HR 97 | Temp 99.1°F | Resp 16 | Ht 66.0 in | Wt 131.4 lb

## 2015-11-12 DIAGNOSIS — B9789 Other viral agents as the cause of diseases classified elsewhere: Principal | ICD-10-CM

## 2015-11-12 DIAGNOSIS — J069 Acute upper respiratory infection, unspecified: Secondary | ICD-10-CM | POA: Diagnosis not present

## 2015-11-12 LAB — POCT INFLUENZA A/B
INFLUENZA A, POC: NEGATIVE
Influenza B, POC: NEGATIVE

## 2015-11-12 MED ORDER — BENZONATATE 100 MG PO CAPS: 100.0000 mg | ORAL_CAPSULE | Freq: Three times a day (TID) | ORAL | 0 refills | Status: DC | PRN

## 2015-11-12 NOTE — Progress Notes (Signed)
Pre visit review using our clinic review tool, if applicable. No additional management support is needed unless otherwise documented below in the visit note. 

## 2015-11-12 NOTE — Telephone Encounter (Signed)
Kenja needs a follow up pneumovax in 2021.  Last bone density showed improvement in her bone density on the prolia.  She is not due for follow up bone density until 5/18 at the earliest.   [name] is not due for pneumonia booster until 2026. They can schedule nurse visit for flu shots.

## 2015-11-12 NOTE — Progress Notes (Signed)
Subjective:    Patient ID: Mary Stevens, female    DOB: 10-23-1940, 75 y.o.   MRN: [id number]  HPI  Mary Stevens is a 75 yr old female who presents today with chief complaint of cough.  She reports that symptoms began on 11/09/15. Has associated nasal congestion, facial pain and otalgia. Mild sore throat which she attributes to coughing.    Review of Systems    see HPI  Past Medical History:  Diagnosis Date  . Cyst of left kidney   . Hypercalcemia   . Hypertension   . Mitral valve prolapse   . Osteoporosis, unspecified      Social History   Social History  . Marital status: Married    Spouse name: N/A  . Number of children: 3  . Years of education: N/A   Occupational History  . Not on file.   Social History Main Topics  . Smoking status: Never Smoker  . Smokeless tobacco: Never Used  . Alcohol use 4.2 oz/week    7 Glasses of wine per week     Comment: daily  . Drug use: No  . Sexual activity: Not on file   Other Topics Concern  . Not on file   Social History Narrative   Married   3 daughters- 6 grandchildren   Retired from [employer]   Completed HS   Enjoys walking/reading, sightseeing, travel   Husband has parkinsons.      Past Surgical History:  Procedure Laterality Date  . GANGLION CYST EXCISION Right 4 2012   wrist  . TONSILLECTOMY      Family History  Problem Relation Age of Onset  . Heart disease Mother     CABG in mid 64s  . Cancer Father     liver  . Diabetes Neg Hx   . Kidney disease Neg Hx   . Asthma Neg Hx     Allergies  Allergen Reactions  . Bactrim [Sulfamethoxazole-Trimethoprim]     Patient unsure.  Marland Kitchen Penicillins Hives  . Valsartan Cough    No current outpatient prescriptions on file prior to visit.   No current facility-administered medications on file prior to visit.     BP 130/71 (BP Location: Right Arm, Cuff Size: Normal)   Pulse 97   Temp 99.1 F (37.3 C) (Oral)   Resp 16   Ht 5\' 6"  (1.676 m)    Wt 131 lb 6.4 oz (59.6 kg)   SpO2 99% Comment: room air  BMI 21.21 kg/m    Objective:   Physical Exam  Constitutional: She is oriented to person, place, and time. She appears well-developed and well-nourished.  HENT:  Head: Normocephalic and atraumatic.  Right Ear: Tympanic membrane and ear canal normal.  Left Ear: Tympanic membrane and ear canal normal.  Mouth/Throat: No oropharyngeal exudate, posterior oropharyngeal edema or posterior oropharyngeal erythema.  Cardiovascular: Normal rate, regular rhythm and normal heart sounds.   No murmur heard. Pulmonary/Chest: Effort normal and breath sounds normal. No respiratory distress. She has no wheezes.  Neurological: She is alert and oriented to person, place, and time.  Psychiatric: She has a normal mood and affect. Her behavior is normal. Judgment and thought content normal.          Assessment & Plan:  Viral URI with cough- Rapid flu swab is negative. Advised pt on supportive measures and follow up as below:  For Aches/pains you may use ibuprofen or tylenol. For cough, you may use tessalon (rx has  been sent to your pharmacy). Call if new/worsening symptoms, fever >101 or if symptoms are not improved in 3-4 days.

## 2015-11-12 NOTE — Patient Instructions (Addendum)
For Aches/pains you may use ibuprofen or tylenol. For cough, you may use tessalon (rx has been sent to your pharmacy). Call if new/worsening symptoms, fever >101 or if symptoms are not improved in 3-4 days.

## 2015-11-12 NOTE — Telephone Encounter (Signed)
Pt replied that she wasn't sure if she wanted to continue getting injection and was wanting to wait and see how next DEXA turns out. Not due until 06/2016. Please advise re: pneumonia vaccines for her and [name].  Last vaccines: Mary Stevens-- pneumococcal 23--04/07/09; prevnar-- 02/07/13 Mary Stevens-- pneumococcal 23-- 06/19/14;  prevnar-- 02/07/13.  Please advise?

## 2015-11-13 NOTE — Addendum Note (Signed)
Addended by: Kelle Darting A on: 11/13/2015 07:41 AM   Modules accepted: Orders

## 2015-11-13 NOTE — Telephone Encounter (Signed)
[place]-- Notified pt of below. Pt has decided to proceed with Prolia. Please let me know when it's approved. Thanks!

## 2015-11-19 NOTE — Telephone Encounter (Signed)
Submitted insurance re-verification today, will update when benefits.

## 2015-11-22 DIAGNOSIS — L821 Other seborrheic keratosis: Secondary | ICD-10-CM | POA: Diagnosis not present

## 2015-11-22 DIAGNOSIS — L578 Other skin changes due to chronic exposure to nonionizing radiation: Secondary | ICD-10-CM | POA: Diagnosis not present

## 2015-11-22 DIAGNOSIS — L570 Actinic keratosis: Secondary | ICD-10-CM | POA: Diagnosis not present

## 2015-11-22 DIAGNOSIS — Z809 Family history of malignant neoplasm, unspecified: Secondary | ICD-10-CM | POA: Diagnosis not present

## 2015-11-22 DIAGNOSIS — L814 Other melanin hyperpigmentation: Secondary | ICD-10-CM | POA: Diagnosis not present

## 2015-11-30 NOTE — Telephone Encounter (Signed)
[place]-- any word about Prolia?

## 2015-12-01 ENCOUNTER — Other Ambulatory Visit: Payer: Self-pay | Admitting: Family

## 2015-12-04 NOTE — Telephone Encounter (Signed)
Received patient benefits, she has 0 out of pocket cost for Prolia. I have given Mary Stevens a note to call the patient to schedule.  Gilmore Laroche, can you order patients prolia?

## 2015-12-04 NOTE — Telephone Encounter (Signed)
Medication was already ordered and pt has nurse visit 12/11/15.

## 2015-12-05 ENCOUNTER — Ambulatory Visit (INDEPENDENT_AMBULATORY_CARE_PROVIDER_SITE_OTHER): Payer: Medicare Other | Admitting: Family

## 2015-12-05 DIAGNOSIS — Z23 Encounter for immunization: Secondary | ICD-10-CM | POA: Diagnosis not present

## 2015-12-07 ENCOUNTER — Encounter: Payer: Self-pay | Admitting: Family

## 2015-12-09 MED ORDER — OMEPRAZOLE 40 MG PO CPDR: 40.0000 mg | DELAYED_RELEASE_CAPSULE | Freq: Every day | ORAL | 5 refills | Status: DC

## 2015-12-11 ENCOUNTER — Ambulatory Visit (INDEPENDENT_AMBULATORY_CARE_PROVIDER_SITE_OTHER): Payer: Medicare Other | Admitting: Behavioral Health

## 2015-12-11 DIAGNOSIS — M810 Age-related osteoporosis without current pathological fracture: Secondary | ICD-10-CM | POA: Diagnosis not present

## 2015-12-11 MED ORDER — DENOSUMAB 60 MG/ML ~~LOC~~ SOLN
60.0000 mg | Freq: Once | SUBCUTANEOUS | Status: AC
  Administered 2015-12-11: 60 mg via SUBCUTANEOUS

## 2015-12-11 NOTE — Progress Notes (Signed)
Pre visit review using our clinic review tool, if applicable. No additional management support is needed unless otherwise documented below in the visit note.  Patient in clinic today for Prolia injection. SQ was given in the Left Arm. Patient tolerated injection well. No signs or symptoms of a reaction prior to leaving the nurse visit.

## 2015-12-13 ENCOUNTER — Other Ambulatory Visit: Payer: Self-pay | Admitting: Family

## 2015-12-13 NOTE — Telephone Encounter (Signed)
Medication filled to pharmacy as requested.   

## 2015-12-17 DIAGNOSIS — R3129 Other microscopic hematuria: Secondary | ICD-10-CM | POA: Diagnosis not present

## 2015-12-17 DIAGNOSIS — N362 Urethral caruncle: Secondary | ICD-10-CM | POA: Diagnosis not present

## 2015-12-31 NOTE — Telephone Encounter (Signed)
Spoke with Mary Stevens at Dr Donne Anon office and was told that these records are at Dr Donne Anon old practice [facility]  [phone]). States [employer] does records releases for law offices. Mary Stevens will fax records release to Dr Donne Anon previous office and we can follow up at above # to check status of records going forward.

## 2016-02-05 NOTE — Telephone Encounter (Signed)
Received call from [place] at [facility]. She states records are at offsite storage and is being moved. Will be another month before records are able to be uploaded and faxed ti ys,

## 2016-02-05 NOTE — Telephone Encounter (Signed)
Called below # and was transferred to medical records voicemail ([place] Davis). Left detailed message to return my call with status of medical records request.

## 2016-02-07 ENCOUNTER — Encounter: Payer: Self-pay | Admitting: Family

## 2016-02-08 MED ORDER — MELOXICAM 7.5 MG PO TABS: 7.5000 mg | ORAL_TABLET | Freq: Every day | ORAL | 0 refills | Status: DC

## 2016-02-11 MED ORDER — MELOXICAM 7.5 MG PO TABS: 7.5000 mg | ORAL_TABLET | Freq: Every day | ORAL | 0 refills | Status: DC

## 2016-02-11 NOTE — Addendum Note (Signed)
Addended byDamita Dunnings D on: 02/11/2016 12:55 PM   Modules accepted: Orders

## 2016-03-03 NOTE — Telephone Encounter (Signed)
Spoke with [place] at [employer] [facility]. She states that pt's records still have not been moved from offsite storage yet. She states offsite storage location changed and they are transferring around 40,000 records. States she has [place] on a list that she is checking routinely and pt's records have not been transferred yet.

## 2016-03-11 ENCOUNTER — Other Ambulatory Visit: Payer: Self-pay | Admitting: Family

## 2016-04-04 ENCOUNTER — Other Ambulatory Visit: Payer: Self-pay | Admitting: Family

## 2016-04-04 NOTE — Telephone Encounter (Signed)
Please advise. 14 day supply of meloxicam filled 03/11/16 by CMA. No recent office visits.

## 2016-04-07 ENCOUNTER — Other Ambulatory Visit: Payer: Self-pay | Admitting: Family

## 2016-04-07 NOTE — Telephone Encounter (Signed)
Pt. Due for follow up. Please  Call to schedule appointment.

## 2016-04-15 DIAGNOSIS — H16223 Keratoconjunctivitis sicca, not specified as Sjogren's, bilateral: Secondary | ICD-10-CM | POA: Diagnosis not present

## 2016-04-15 DIAGNOSIS — H2513 Age-related nuclear cataract, bilateral: Secondary | ICD-10-CM | POA: Diagnosis not present

## 2016-05-06 ENCOUNTER — Encounter: Payer: Self-pay | Admitting: Family

## 2016-05-06 ENCOUNTER — Other Ambulatory Visit: Payer: Self-pay | Admitting: Family

## 2016-05-06 ENCOUNTER — Telehealth: Payer: Self-pay | Admitting: Family

## 2016-05-06 DIAGNOSIS — M810 Age-related osteoporosis without current pathological fracture: Secondary | ICD-10-CM

## 2016-05-06 MED ORDER — MELOXICAM 7.5 MG PO TABS: ORAL_TABLET | ORAL | 0 refills | Status: DC

## 2016-05-06 NOTE — Telephone Encounter (Signed)
Patient would like to discuss message below before scheduling labs, please advise

## 2016-05-06 NOTE — Telephone Encounter (Signed)
Pt will need bmet prior to injection if she is willing to proceed with Prolia. Left message for pt to return my call.

## 2016-05-06 NOTE — Telephone Encounter (Signed)
Prolia benefits verified No PA required Dual coverage-[employer] should cover 100% after Medicare  Patient due after 06/08/16

## 2016-05-07 NOTE — Telephone Encounter (Signed)
Spoke with pt. She states her and spouse are in the process of moving to [facility]. She scheduled lab appt for Friday, future order entered. She will call back to schedule prolia injection for April once she knows her schedule and where she will be.

## 2016-05-09 ENCOUNTER — Other Ambulatory Visit (INDEPENDENT_AMBULATORY_CARE_PROVIDER_SITE_OTHER): Payer: Medicare Other

## 2016-05-09 ENCOUNTER — Telehealth: Payer: Self-pay | Admitting: Family

## 2016-05-09 DIAGNOSIS — M810 Age-related osteoporosis without current pathological fracture: Secondary | ICD-10-CM | POA: Diagnosis not present

## 2016-05-09 DIAGNOSIS — E876 Hypokalemia: Secondary | ICD-10-CM

## 2016-05-09 LAB — BASIC METABOLIC PANEL
BUN: 16 mg/dL (ref 6–23)
CHLORIDE: 102 meq/L (ref 96–112)
CO2: 29 mEq/L (ref 19–32)
CREATININE: 0.79 mg/dL (ref 0.40–1.20)
Calcium: 9.9 mg/dL (ref 8.4–10.5)
GFR: 75.45 mL/min (ref >60.00)
Glucose, Bld: 64 mg/dL — ABNORMAL LOW (ref 70–99)
Potassium: 3.2 mEq/L — ABNORMAL LOW (ref 3.5–5.1)
SODIUM: 138 meq/L (ref 135–145)

## 2016-05-09 MED ORDER — POTASSIUM CHLORIDE CRYS ER 20 MEQ PO TBCR: 20.0000 meq | EXTENDED_RELEASE_TABLET | Freq: Every day | ORAL | 3 refills | Status: AC

## 2016-05-09 NOTE — Telephone Encounter (Signed)
Potassium is low. Stat kdur- take 2 tabs by mouth today, then one tab once daily. Repeat bmet in 1 week. Dx kdur.

## 2016-05-09 NOTE — Telephone Encounter (Signed)
Notified pt and she voices understanding. Lab appt scheduled for 05/15/16 at 4pm; future order entered.

## 2016-05-15 ENCOUNTER — Other Ambulatory Visit (INDEPENDENT_AMBULATORY_CARE_PROVIDER_SITE_OTHER): Payer: Medicare Other

## 2016-05-15 DIAGNOSIS — E876 Hypokalemia: Secondary | ICD-10-CM | POA: Diagnosis not present

## 2016-05-15 LAB — BASIC METABOLIC PANEL
BUN: 14 mg/dL (ref 6–23)
CALCIUM: 10.1 mg/dL (ref 8.4–10.5)
CHLORIDE: 105 meq/L (ref 96–112)
CO2: 30 meq/L (ref 19–32)
Creatinine, Ser: 0.76 mg/dL (ref 0.40–1.20)
GFR: 78.9 mL/min (ref >60.00)
Glucose, Bld: 79 mg/dL (ref 70–99)
Potassium: 3.5 mEq/L (ref 3.5–5.1)
SODIUM: 140 meq/L (ref 135–145)

## 2016-05-16 ENCOUNTER — Encounter: Payer: Self-pay | Admitting: Family

## 2016-06-01 ENCOUNTER — Encounter: Payer: Self-pay | Admitting: Family

## 2016-06-02 NOTE — Telephone Encounter (Signed)
OK 

## 2016-06-02 NOTE — Telephone Encounter (Signed)
Spoke with Ebony Hail at [facility], [phone] and verified that in clinical trials, prolia was given 1 month before or 1 month after the 6 month date. Please advise if ok to go ahead and schedule nurse visit tomorrow. I think 6 months will be 06/09/16?

## 2016-06-03 ENCOUNTER — Ambulatory Visit: Payer: Medicare Other

## 2016-06-06 ENCOUNTER — Other Ambulatory Visit: Payer: Self-pay | Admitting: Family

## 2016-07-08 ENCOUNTER — Encounter: Payer: Self-pay | Admitting: Family

## 2016-07-08 ENCOUNTER — Telehealth: Payer: Self-pay | Admitting: Family

## 2016-07-08 DIAGNOSIS — E2839 Other primary ovarian failure: Secondary | ICD-10-CM

## 2016-07-08 NOTE — Telephone Encounter (Signed)
-----   Message from Katha Hamming sent at 07/08/2016  1:17 PM EDT ----- Regarding: [place] would like for you to put an order in for her to get a bone density.  Her last one was 07/03/14.  She is going to get both the mammogram and bone density done at same time.   Thanks, Hoyle Sauer

## 2016-07-11 ENCOUNTER — Other Ambulatory Visit: Payer: Self-pay | Admitting: Family

## 2016-07-11 DIAGNOSIS — Z1231 Encounter for screening mammogram for malignant neoplasm of breast: Secondary | ICD-10-CM

## 2016-07-18 ENCOUNTER — Encounter: Payer: Self-pay | Admitting: Family

## 2016-07-18 NOTE — Telephone Encounter (Signed)
Could you please sent rx to her new pharmacy- I have pended. Thanks!

## 2016-07-22 MED ORDER — MECLIZINE HCL 25 MG PO TABS: 25.0000 mg | ORAL_TABLET | Freq: Three times a day (TID) | ORAL | 2 refills | Status: DC | PRN

## 2016-07-22 NOTE — Telephone Encounter (Signed)
Rx sent, message sent to pt.

## 2016-08-25 ENCOUNTER — Ambulatory Visit (HOSPITAL_BASED_OUTPATIENT_CLINIC_OR_DEPARTMENT_OTHER)
Admission: RE | Admit: 2016-08-25 | Discharge: 2016-08-25 | Disposition: A | Discharge status: Home / Self Care | Payer: Medicare Other | Source: Ambulatory Visit | Attending: Family | Admitting: Family

## 2016-08-25 ENCOUNTER — Ambulatory Visit (INDEPENDENT_AMBULATORY_CARE_PROVIDER_SITE_OTHER): Payer: Medicare Other | Admitting: Family

## 2016-08-25 ENCOUNTER — Encounter: Payer: Self-pay | Admitting: Family

## 2016-08-25 ENCOUNTER — Telehealth: Payer: Self-pay | Admitting: Family

## 2016-08-25 VITALS — BP 133/87 | HR 83 | Temp 98.2°F | Resp 16 | Ht 68.0 in | Wt 133.6 lb

## 2016-08-25 DIAGNOSIS — M810 Age-related osteoporosis without current pathological fracture: Secondary | ICD-10-CM | POA: Diagnosis not present

## 2016-08-25 DIAGNOSIS — R05 Cough: Secondary | ICD-10-CM

## 2016-08-25 DIAGNOSIS — R053 Chronic cough: Secondary | ICD-10-CM

## 2016-08-25 DIAGNOSIS — M85851 Other specified disorders of bone density and structure, right thigh: Secondary | ICD-10-CM | POA: Diagnosis not present

## 2016-08-25 DIAGNOSIS — E2839 Other primary ovarian failure: Secondary | ICD-10-CM | POA: Diagnosis not present

## 2016-08-25 DIAGNOSIS — R059 Cough, unspecified: Secondary | ICD-10-CM

## 2016-08-25 DIAGNOSIS — I10 Essential (primary) hypertension: Secondary | ICD-10-CM

## 2016-08-25 DIAGNOSIS — M8589 Other specified disorders of bone density and structure, multiple sites: Secondary | ICD-10-CM | POA: Diagnosis not present

## 2016-08-25 LAB — BASIC METABOLIC PANEL
BUN: 16 mg/dL (ref 6–23)
CO2: 30 mEq/L (ref 19–32)
CREATININE: 0.76 mg/dL (ref 0.40–1.20)
Calcium: 9.7 mg/dL (ref 8.4–10.5)
Chloride: 105 mEq/L (ref 96–112)
GFR: 78.84 mL/min (ref >60.00)
GLUCOSE: 80 mg/dL (ref 70–99)
POTASSIUM: 3.5 meq/L (ref 3.5–5.1)
Sodium: 141 mEq/L (ref 135–145)

## 2016-08-25 NOTE — Telephone Encounter (Signed)
Please advise pt that chest x ray is clear.  I have placed order for pulmonary referral in [place] city. When she comes back for prolia, I would like her to have a vitamin D level drawn that day as well.

## 2016-08-25 NOTE — Progress Notes (Signed)
Subjective:    Patient ID: Mary Stevens, female    DOB: May 08, 1940, 76 y.o.   MRN: [id number]  HPI  Mary Stevens is a 76 yr old female who presents today for follow up.  1) HTN- She is maintained on amlodipine 5mg  once daily.  BP Readings from Last 3 Encounters:  08/25/16 133/87  11/12/15 130/71  09/11/15 126/82   2) Cough- reports dry cough x 2 months. Reports that this is the same cough that she had a while back while on valsartan. Cough has improved some.   She continues omeprazole and an allergy pill. Usually only has allergy symptoms when she forgets to take her allergy pills.  Cough is worse in the AM.    3) Osteoporosis-  Had bone density today which shows deterioration of her T score. Was -1.8 in 2016, now -2.1. She had been on prolia in the past but this was discontinued due to concerns by her dentist that it would interfere with dental surgeries.  She had been on calcium in the past but her calcium level rose too high and this was discontinued.   Review of Systems See HPI  Past Medical History:  Diagnosis Date  . Cyst of left kidney   . Hypercalcemia   . Hypertension   . Mitral valve prolapse   . Osteoporosis, unspecified      Social History   Social History  . Marital status: Married    Spouse name: N/A  . Number of children: 3  . Years of education: N/A   Occupational History  . Not on file.   Social History Main Topics  . Smoking status: Never Smoker  . Smokeless tobacco: Never Used  . Alcohol use 4.2 oz/week    7 Glasses of wine per week     Comment: daily  . Drug use: No  . Sexual activity: Not on file   Other Topics Concern  . Not on file   Social History Narrative   Married   3 daughters- 6 grandchildren   Retired from [employer]   Completed HS   Enjoys walking/reading, sightseeing, travel   Husband has parkinsons.      Past Surgical History:  Procedure Laterality Date  . GANGLION CYST EXCISION Right 4 2012   wrist    . TONSILLECTOMY      Family History  Problem Relation Age of Onset  . Heart disease Mother        CABG in mid 43s  . Cancer Father        liver  . Diabetes Neg Hx   . Kidney disease Neg Hx   . Asthma Neg Hx     Allergies  Allergen Reactions  . Bactrim [Sulfamethoxazole-Trimethoprim]     Patient unsure.  Marland Kitchen Penicillins Hives  . Valsartan Cough    Current Outpatient Prescriptions on File Prior to Visit  Medication Sig Dispense Refill  . amLODipine (NORVASC) 5 MG tablet TAKE 1 TABLET(5 MG) BY MOUTH DAILY 90 tablet 0  . benzonatate (TESSALON) 100 MG capsule Take 1 capsule (100 mg total) by mouth 3 (three) times daily as needed. 20 capsule 0  . meclizine (ANTIVERT) 25 MG tablet Take 1 tablet (25 mg total) by mouth 3 (three) times daily as needed for dizziness. 10 tablet 2  . meloxicam (MOBIC) 7.5 MG tablet TAKE 1 TABLET(7.5 MG) BY MOUTH DAILY 30 tablet 0  . omeprazole (PRILOSEC) 40 MG capsule TAKE 1 CAPSULE(40 MG) BY MOUTH DAILY 90 capsule  0  . potassium chloride SA (K-DUR,KLOR-CON) 20 MEQ tablet Take 1 tablet (20 mEq total) by mouth daily. 30 tablet 3   No current facility-administered medications on file prior to visit.     BP 133/87 (BP Location: Left Arm, Cuff Size: Normal)   Pulse 83   Temp 98.2 F (36.8 C) (Oral)   Resp 16   Ht 5\' 8"  (1.727 m)   Wt 133 lb 9.6 oz (60.6 kg)   SpO2 100%   BMI 20.31 kg/m       Objective:   Physical Exam  Constitutional: She is oriented to person, place, and time. She appears well-developed and well-nourished.  HENT:  Head: Normocephalic.  Cardiovascular: Normal rate, regular rhythm and normal heart sounds.   No murmur heard. Pulmonary/Chest: Effort normal and breath sounds normal. No respiratory distress. She has no wheezes.  Musculoskeletal: She exhibits no edema.  Neurological: She is alert and oriented to person, place, and time.  Psychiatric: She has a normal mood and affect. Her behavior is normal. Judgment and thought  content normal.          Assessment & Plan:  Cough- etiology is unclear. Check cxr. If CXR normal, consider referral to pulmonology.  HTN- BP  Stable, continue current meds. Obtain follow up bmet.   Osteoporosis- deteriorated. Plan to restart prolia once insurance coverage is verified.

## 2016-08-25 NOTE — Patient Instructions (Signed)
Please complete your chest x ray on the first floor.  Complete lab work prior to leaving.

## 2016-08-25 NOTE — Telephone Encounter (Signed)
Notified pt and she is agreeable to pulmonology referral. States she will have to call back to schedule nurse / lab visit since she isn't sure when she will be back in this area. Prolia is in fridge for pt whenever she is able to schedule appt.

## 2016-08-29 ENCOUNTER — Encounter: Payer: Self-pay | Admitting: Family

## 2016-09-01 NOTE — Telephone Encounter (Signed)
Spoke with Drue Dun (referral co-ordinator at [facility]) referral faxed to [facility]. Spoke with referral coordinator at [employer] and advised them to disregard previous referral. Sent message to pt.

## 2016-09-08 ENCOUNTER — Other Ambulatory Visit: Payer: Self-pay | Admitting: Family

## 2016-09-11 ENCOUNTER — Telehealth: Payer: Self-pay | Admitting: Family

## 2016-09-11 NOTE — Telephone Encounter (Signed)
Prolia benefits verified No PA required $183 deductible met 20% co-insurance for Admin and Prolia will be covered by secondary  Patient may owe approximately $0 OOP

## 2016-09-12 NOTE — Telephone Encounter (Signed)
Please schedule prolia injection.

## 2016-09-15 DIAGNOSIS — R05 Cough: Secondary | ICD-10-CM | POA: Diagnosis not present

## 2016-09-15 DIAGNOSIS — Z6820 Body mass index (BMI) 20.0-20.9, adult: Secondary | ICD-10-CM | POA: Diagnosis not present

## 2016-09-15 DIAGNOSIS — I10 Essential (primary) hypertension: Secondary | ICD-10-CM | POA: Diagnosis not present

## 2016-09-15 DIAGNOSIS — M810 Age-related osteoporosis without current pathological fracture: Secondary | ICD-10-CM | POA: Diagnosis not present

## 2016-09-17 NOTE — Telephone Encounter (Signed)
Tricia-please order prolia injection.

## 2016-09-22 NOTE — Telephone Encounter (Signed)
Yes!  Thank you Gilmore Laroche.    Ronnie--please schedule.

## 2016-09-22 NOTE — Telephone Encounter (Signed)
I think I had already ordered Prolia and it is in the fridge for pt?
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
## 2016-11-11 DIAGNOSIS — Z23 Encounter for immunization: Secondary | ICD-10-CM | POA: Diagnosis not present

## 2016-11-12 ENCOUNTER — Encounter: Payer: Self-pay | Admitting: Family

## 2016-11-13 ENCOUNTER — Emergency Department (HOSPITAL_COMMUNITY): Payer: Medicare Other

## 2016-11-13 ENCOUNTER — Encounter (HOSPITAL_COMMUNITY): Payer: Self-pay | Admitting: Emergency Medicine

## 2016-11-13 ENCOUNTER — Telehealth: Payer: Self-pay | Admitting: Family

## 2016-11-13 DIAGNOSIS — I10 Essential (primary) hypertension: Secondary | ICD-10-CM | POA: Insufficient documentation

## 2016-11-13 DIAGNOSIS — R0789 Other chest pain: Secondary | ICD-10-CM | POA: Diagnosis not present

## 2016-11-13 DIAGNOSIS — Z79899 Other long term (current) drug therapy: Secondary | ICD-10-CM | POA: Insufficient documentation

## 2016-11-13 DIAGNOSIS — R079 Chest pain, unspecified: Secondary | ICD-10-CM | POA: Diagnosis not present

## 2016-11-13 LAB — BASIC METABOLIC PANEL
Anion gap: 8 (ref 5–15)
BUN: 12 mg/dL (ref 6–20)
CALCIUM: 10.3 mg/dL (ref 8.9–10.3)
CO2: 27 mmol/L (ref 22–32)
CREATININE: 0.98 mg/dL (ref 0.44–1.00)
Chloride: 104 mmol/L (ref 101–111)
GFR calc Af Amer: >60 mL/min (ref >60)
GFR calc non Af Amer: 55 mL/min — ABNORMAL LOW (ref >60)
GLUCOSE: 135 mg/dL — AB (ref 65–99)
Potassium: 3.3 mmol/L — ABNORMAL LOW (ref 3.5–5.1)
Sodium: 139 mmol/L (ref 135–145)

## 2016-11-13 LAB — CBC
HCT: 42.2 % (ref 36.0–46.0)
HEMOGLOBIN: 14.4 g/dL (ref 12.0–15.0)
MCH: 32 pg (ref 26.0–34.0)
MCHC: 34.1 g/dL (ref 30.0–36.0)
MCV: 93.8 fL (ref 78.0–100.0)
PLATELETS: 247 10*3/uL (ref 150–400)
RBC: 4.5 MIL/uL (ref 3.87–5.11)
RDW: 13.4 % (ref 11.5–15.5)
WBC: 6 10*3/uL (ref 4.0–10.5)

## 2016-11-13 LAB — I-STAT TROPONIN, ED: TROPONIN I, POC: 0.01 ng/mL (ref 0.00–0.08)
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
## 2016-11-13 NOTE — Telephone Encounter (Signed)
Pt sent a My Chart message to schedule an apt due to having "Chest feels tight and anxious" Pt scheduled an apt for Monday (10/28/16). I called pt to have discuss further. I did move pt's apt with provider sooner (11/14/16) and also due to concern I forwarded call to team health for triaging.

## 2016-11-14 ENCOUNTER — Emergency Department (HOSPITAL_COMMUNITY)
Admission: EM | Admit: 2016-11-14 | Discharge: 2016-11-14 | Disposition: A | Discharge status: Home / Self Care | Payer: Medicare Other | Attending: Emergency Medicine | Admitting: Emergency Medicine

## 2016-11-14 ENCOUNTER — Other Ambulatory Visit: Payer: Self-pay

## 2016-11-14 ENCOUNTER — Ambulatory Visit: Payer: Self-pay | Admitting: Family

## 2016-11-14 DIAGNOSIS — R0789 Other chest pain: Secondary | ICD-10-CM

## 2016-11-14 DIAGNOSIS — Z0289 Encounter for other administrative examinations: Secondary | ICD-10-CM

## 2016-11-14 LAB — I-STAT TROPONIN, ED: TROPONIN I, POC: 0 ng/mL (ref 0.00–0.08)

## 2016-11-14 NOTE — Discharge Instructions (Signed)
Your EKG did not show any acute abnormalities. You have had 2 negative sets of cardiac enzymes. Your chest x-ray was clear. I recommend close follow-up with your cardiologist.  If you have return of chest discomfort, feel short of breath, feel like you may pass out, begin suddenly sweating, have pain or swelling in one leg, please return to the closest emergency department.
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
## 2016-11-17 ENCOUNTER — Ambulatory Visit: Payer: Self-pay | Admitting: Family

## 2016-11-18 ENCOUNTER — Ambulatory Visit (HOSPITAL_BASED_OUTPATIENT_CLINIC_OR_DEPARTMENT_OTHER)
Admission: RE | Admit: 2016-11-18 | Discharge: 2016-11-18 | Disposition: A | Discharge status: Home / Self Care | Payer: Medicare Other | Source: Ambulatory Visit | Attending: Family | Admitting: Family

## 2016-11-18 DIAGNOSIS — Z1231 Encounter for screening mammogram for malignant neoplasm of breast: Secondary | ICD-10-CM

## 2016-12-05 DIAGNOSIS — E559 Vitamin D deficiency, unspecified: Secondary | ICD-10-CM | POA: Diagnosis not present

## 2016-12-08 ENCOUNTER — Encounter: Payer: Self-pay | Admitting: Family

## 2016-12-09 DIAGNOSIS — F439 Reaction to severe stress, unspecified: Secondary | ICD-10-CM | POA: Diagnosis not present

## 2016-12-09 DIAGNOSIS — Z6821 Body mass index (BMI) 21.0-21.9, adult: Secondary | ICD-10-CM | POA: Diagnosis not present

## 2016-12-09 DIAGNOSIS — E559 Vitamin D deficiency, unspecified: Secondary | ICD-10-CM | POA: Diagnosis not present

## 2016-12-09 DIAGNOSIS — I10 Essential (primary) hypertension: Secondary | ICD-10-CM | POA: Diagnosis not present

## 2016-12-09 DIAGNOSIS — M810 Age-related osteoporosis without current pathological fracture: Secondary | ICD-10-CM | POA: Diagnosis not present

## 2016-12-09 DIAGNOSIS — R079 Chest pain, unspecified: Secondary | ICD-10-CM | POA: Diagnosis not present

## 2016-12-12 ENCOUNTER — Encounter: Payer: Self-pay | Admitting: Family

## 2016-12-15 DIAGNOSIS — R079 Chest pain, unspecified: Secondary | ICD-10-CM | POA: Diagnosis not present

## 2016-12-17 DIAGNOSIS — L570 Actinic keratosis: Secondary | ICD-10-CM | POA: Diagnosis not present

## 2016-12-17 DIAGNOSIS — L814 Other melanin hyperpigmentation: Secondary | ICD-10-CM | POA: Diagnosis not present

## 2016-12-17 DIAGNOSIS — D2271 Melanocytic nevi of right lower limb, including hip: Secondary | ICD-10-CM | POA: Diagnosis not present

## 2016-12-17 DIAGNOSIS — D2272 Melanocytic nevi of left lower limb, including hip: Secondary | ICD-10-CM | POA: Diagnosis not present

## 2016-12-17 DIAGNOSIS — D225 Melanocytic nevi of trunk: Secondary | ICD-10-CM | POA: Diagnosis not present

## 2016-12-17 DIAGNOSIS — L821 Other seborrheic keratosis: Secondary | ICD-10-CM | POA: Diagnosis not present

## 2017-01-01 DIAGNOSIS — H2513 Age-related nuclear cataract, bilateral: Secondary | ICD-10-CM | POA: Diagnosis not present

## 2017-01-09 ENCOUNTER — Telehealth: Payer: Self-pay | Admitting: *Deleted

## 2017-01-09 NOTE — Telephone Encounter (Signed)
Received request for Medical Records from [facility]; forwarded to [place] for email/scana/SLS 01/16

## 2017-01-29 ENCOUNTER — Other Ambulatory Visit: Payer: Self-pay

## 2017-02-06 DIAGNOSIS — M810 Age-related osteoporosis without current pathological fracture: Secondary | ICD-10-CM | POA: Diagnosis not present

## 2017-02-16 DIAGNOSIS — B9789 Other viral agents as the cause of diseases classified elsewhere: Secondary | ICD-10-CM | POA: Diagnosis not present

## 2017-02-16 DIAGNOSIS — J069 Acute upper respiratory infection, unspecified: Secondary | ICD-10-CM | POA: Diagnosis not present

## 2017-02-16 DIAGNOSIS — R0989 Other specified symptoms and signs involving the circulatory and respiratory systems: Secondary | ICD-10-CM | POA: Diagnosis not present

## 2017-02-16 DIAGNOSIS — Z6820 Body mass index (BMI) 20.0-20.9, adult: Secondary | ICD-10-CM | POA: Diagnosis not present

## 2017-02-16 DIAGNOSIS — I10 Essential (primary) hypertension: Secondary | ICD-10-CM | POA: Diagnosis not present

## 2017-03-07 ENCOUNTER — Other Ambulatory Visit: Payer: Self-pay | Admitting: Family

## 2017-03-16 DIAGNOSIS — E559 Vitamin D deficiency, unspecified: Secondary | ICD-10-CM | POA: Diagnosis not present

## 2017-03-16 DIAGNOSIS — R3121 Asymptomatic microscopic hematuria: Secondary | ICD-10-CM | POA: Diagnosis not present

## 2017-03-16 DIAGNOSIS — I10 Essential (primary) hypertension: Secondary | ICD-10-CM | POA: Diagnosis not present

## 2017-03-24 DIAGNOSIS — E559 Vitamin D deficiency, unspecified: Secondary | ICD-10-CM | POA: Diagnosis not present

## 2017-03-24 DIAGNOSIS — I499 Cardiac arrhythmia, unspecified: Secondary | ICD-10-CM | POA: Diagnosis not present

## 2017-03-24 DIAGNOSIS — R9431 Abnormal electrocardiogram [ECG] [EKG]: Secondary | ICD-10-CM | POA: Diagnosis not present

## 2017-03-24 DIAGNOSIS — R3121 Asymptomatic microscopic hematuria: Secondary | ICD-10-CM | POA: Diagnosis not present

## 2017-03-24 DIAGNOSIS — I10 Essential (primary) hypertension: Secondary | ICD-10-CM | POA: Diagnosis not present

## 2017-03-24 DIAGNOSIS — M810 Age-related osteoporosis without current pathological fracture: Secondary | ICD-10-CM | POA: Diagnosis not present

## 2017-03-24 DIAGNOSIS — Z6820 Body mass index (BMI) 20.0-20.9, adult: Secondary | ICD-10-CM | POA: Diagnosis not present

## 2017-03-24 DIAGNOSIS — R000 Tachycardia, unspecified: Secondary | ICD-10-CM | POA: Diagnosis not present

## 2017-04-02 ENCOUNTER — Telehealth: Payer: Self-pay | Admitting: *Deleted

## 2017-04-02 NOTE — Telephone Encounter (Signed)
Received request for Medical Records from [facility]; forwarded to [place] for email/scan/SLS 02/07

## 2017-04-23 DIAGNOSIS — E559 Vitamin D deficiency, unspecified: Secondary | ICD-10-CM | POA: Diagnosis not present

## 2017-04-23 DIAGNOSIS — Z6820 Body mass index (BMI) 20.0-20.9, adult: Secondary | ICD-10-CM | POA: Diagnosis not present

## 2017-04-23 DIAGNOSIS — I499 Cardiac arrhythmia, unspecified: Secondary | ICD-10-CM | POA: Diagnosis not present

## 2017-04-23 DIAGNOSIS — I10 Essential (primary) hypertension: Secondary | ICD-10-CM | POA: Diagnosis not present

## 2017-06-04 DIAGNOSIS — L298 Other pruritus: Secondary | ICD-10-CM | POA: Diagnosis not present

## 2017-06-04 DIAGNOSIS — L570 Actinic keratosis: Secondary | ICD-10-CM | POA: Diagnosis not present

## 2017-06-04 DIAGNOSIS — L853 Xerosis cutis: Secondary | ICD-10-CM | POA: Diagnosis not present

## 2017-06-04 DIAGNOSIS — L538 Other specified erythematous conditions: Secondary | ICD-10-CM | POA: Diagnosis not present

## 2017-06-04 DIAGNOSIS — L820 Inflamed seborrheic keratosis: Secondary | ICD-10-CM | POA: Diagnosis not present

## 2017-06-04 DIAGNOSIS — D485 Neoplasm of uncertain behavior of skin: Secondary | ICD-10-CM | POA: Diagnosis not present

## 2017-06-08 DIAGNOSIS — E559 Vitamin D deficiency, unspecified: Secondary | ICD-10-CM | POA: Diagnosis not present

## 2017-06-08 DIAGNOSIS — I499 Cardiac arrhythmia, unspecified: Secondary | ICD-10-CM | POA: Diagnosis not present

## 2017-06-08 DIAGNOSIS — R3121 Asymptomatic microscopic hematuria: Secondary | ICD-10-CM | POA: Diagnosis not present

## 2017-06-18 DIAGNOSIS — I10 Essential (primary) hypertension: Secondary | ICD-10-CM | POA: Diagnosis not present

## 2017-06-18 DIAGNOSIS — I499 Cardiac arrhythmia, unspecified: Secondary | ICD-10-CM | POA: Diagnosis not present

## 2017-06-18 DIAGNOSIS — Z8639 Personal history of other endocrine, nutritional and metabolic disease: Secondary | ICD-10-CM | POA: Diagnosis not present

## 2017-06-18 DIAGNOSIS — E559 Vitamin D deficiency, unspecified: Secondary | ICD-10-CM | POA: Diagnosis not present

## 2017-06-18 DIAGNOSIS — Z6821 Body mass index (BMI) 21.0-21.9, adult: Secondary | ICD-10-CM | POA: Diagnosis not present

## 2017-07-13 DIAGNOSIS — L570 Actinic keratosis: Secondary | ICD-10-CM | POA: Diagnosis not present

## 2017-07-13 DIAGNOSIS — L820 Inflamed seborrheic keratosis: Secondary | ICD-10-CM | POA: Diagnosis not present

## 2017-07-13 DIAGNOSIS — L821 Other seborrheic keratosis: Secondary | ICD-10-CM | POA: Diagnosis not present

## 2017-07-13 DIAGNOSIS — L538 Other specified erythematous conditions: Secondary | ICD-10-CM | POA: Diagnosis not present

## 2017-07-30 DIAGNOSIS — R05 Cough: Secondary | ICD-10-CM | POA: Diagnosis not present

## 2017-07-30 DIAGNOSIS — I10 Essential (primary) hypertension: Secondary | ICD-10-CM | POA: Diagnosis not present

## 2017-07-30 DIAGNOSIS — F320 Major depressive disorder, single episode, mild: Secondary | ICD-10-CM | POA: Diagnosis not present

## 2017-07-30 DIAGNOSIS — Z6821 Body mass index (BMI) 21.0-21.9, adult: Secondary | ICD-10-CM | POA: Diagnosis not present

## 2017-08-05 DIAGNOSIS — Z6821 Body mass index (BMI) 21.0-21.9, adult: Secondary | ICD-10-CM | POA: Diagnosis not present

## 2017-08-05 DIAGNOSIS — I10 Essential (primary) hypertension: Secondary | ICD-10-CM | POA: Diagnosis not present

## 2017-08-05 DIAGNOSIS — R05 Cough: Secondary | ICD-10-CM | POA: Diagnosis not present

## 2017-08-21 DIAGNOSIS — M810 Age-related osteoporosis without current pathological fracture: Secondary | ICD-10-CM | POA: Diagnosis not present

## 2017-09-16 DIAGNOSIS — I10 Essential (primary) hypertension: Secondary | ICD-10-CM | POA: Diagnosis not present

## 2017-09-16 DIAGNOSIS — Z6822 Body mass index (BMI) 22.0-22.9, adult: Secondary | ICD-10-CM | POA: Diagnosis not present

## 2017-09-16 DIAGNOSIS — R05 Cough: Secondary | ICD-10-CM | POA: Diagnosis not present

## 2019-06-17 IMAGING — CR DG CHEST 2V
2 series · 2 of 2 positions shown · non-contrast
Comparison: None.

CLINICAL DATA: Intermittent cough for 2-3 months.

EXAM:
CHEST  2 VIEW

[w chest pa]
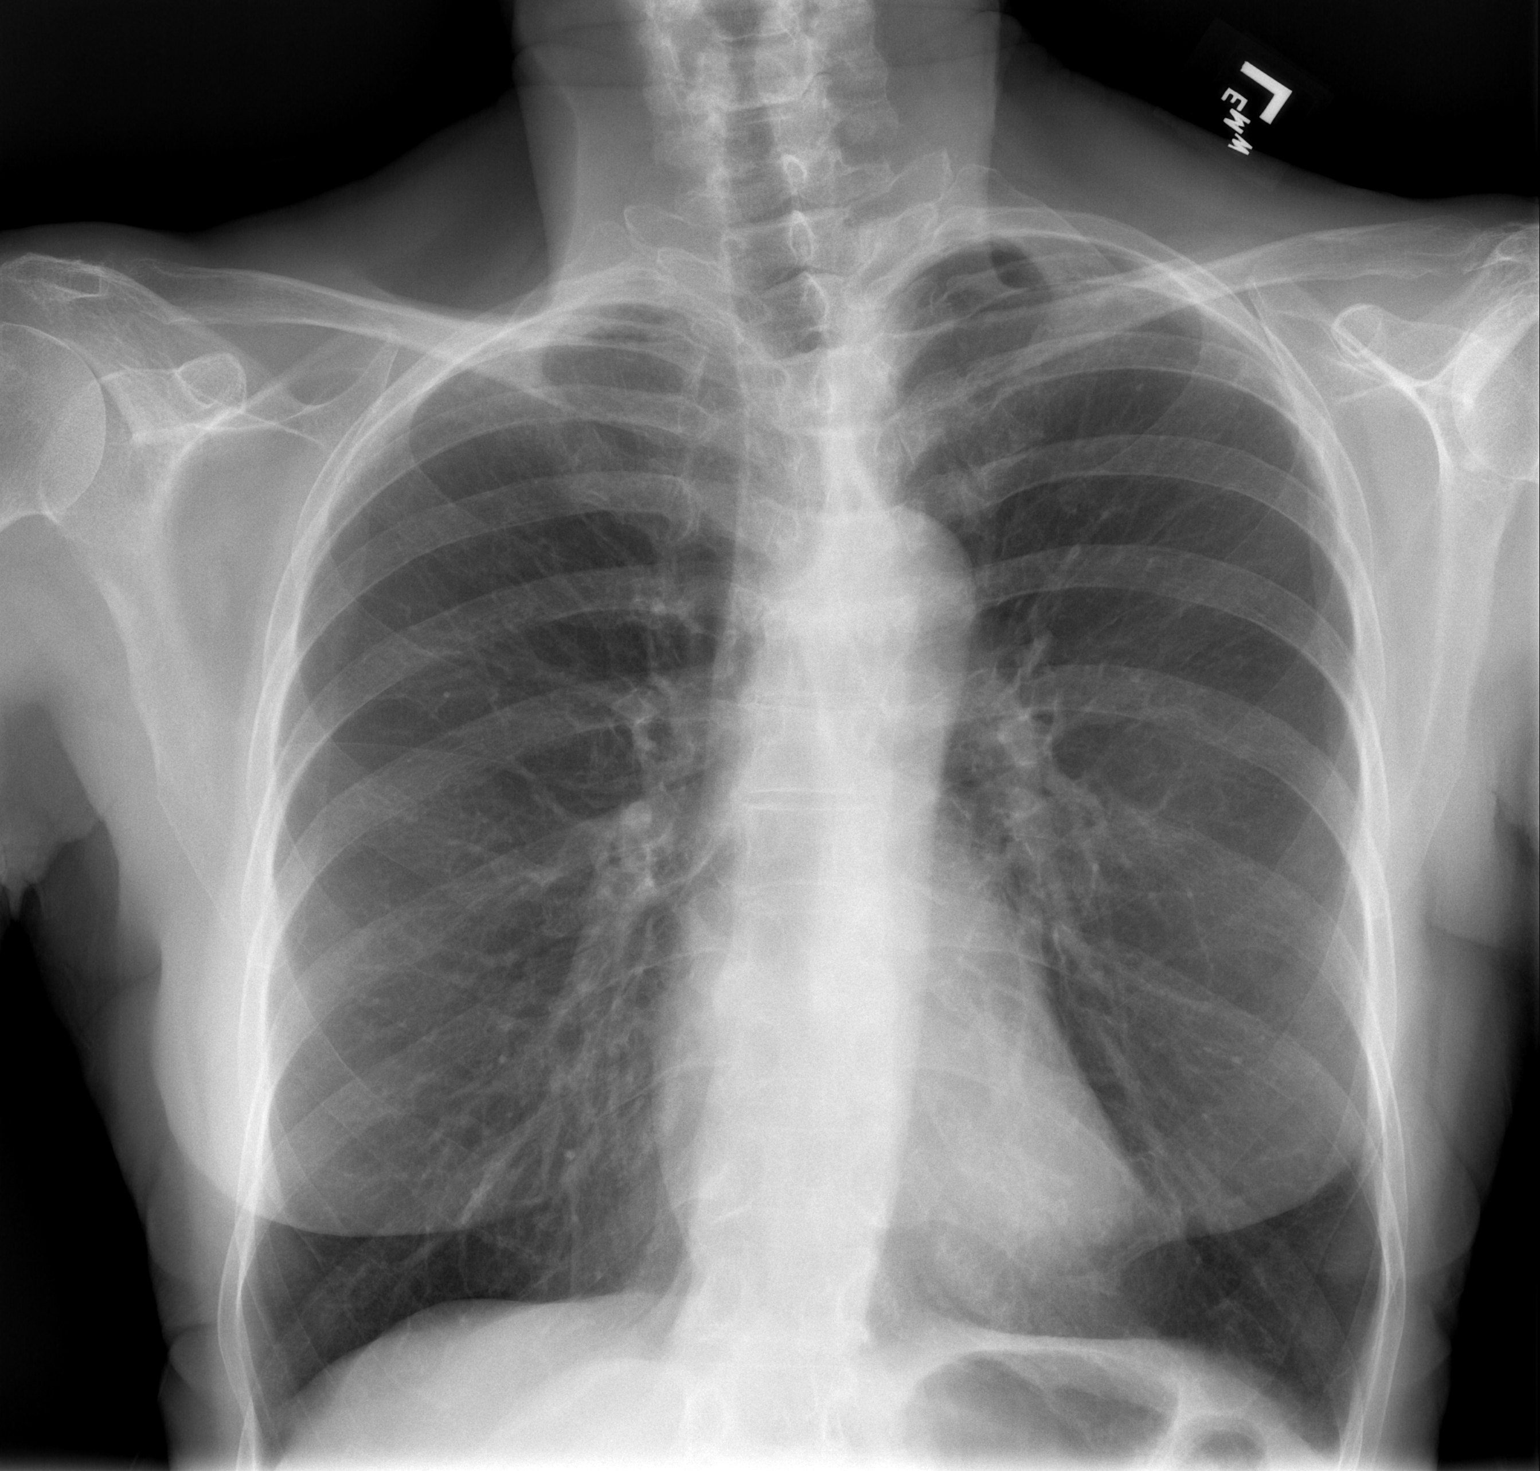

[w chest lat]
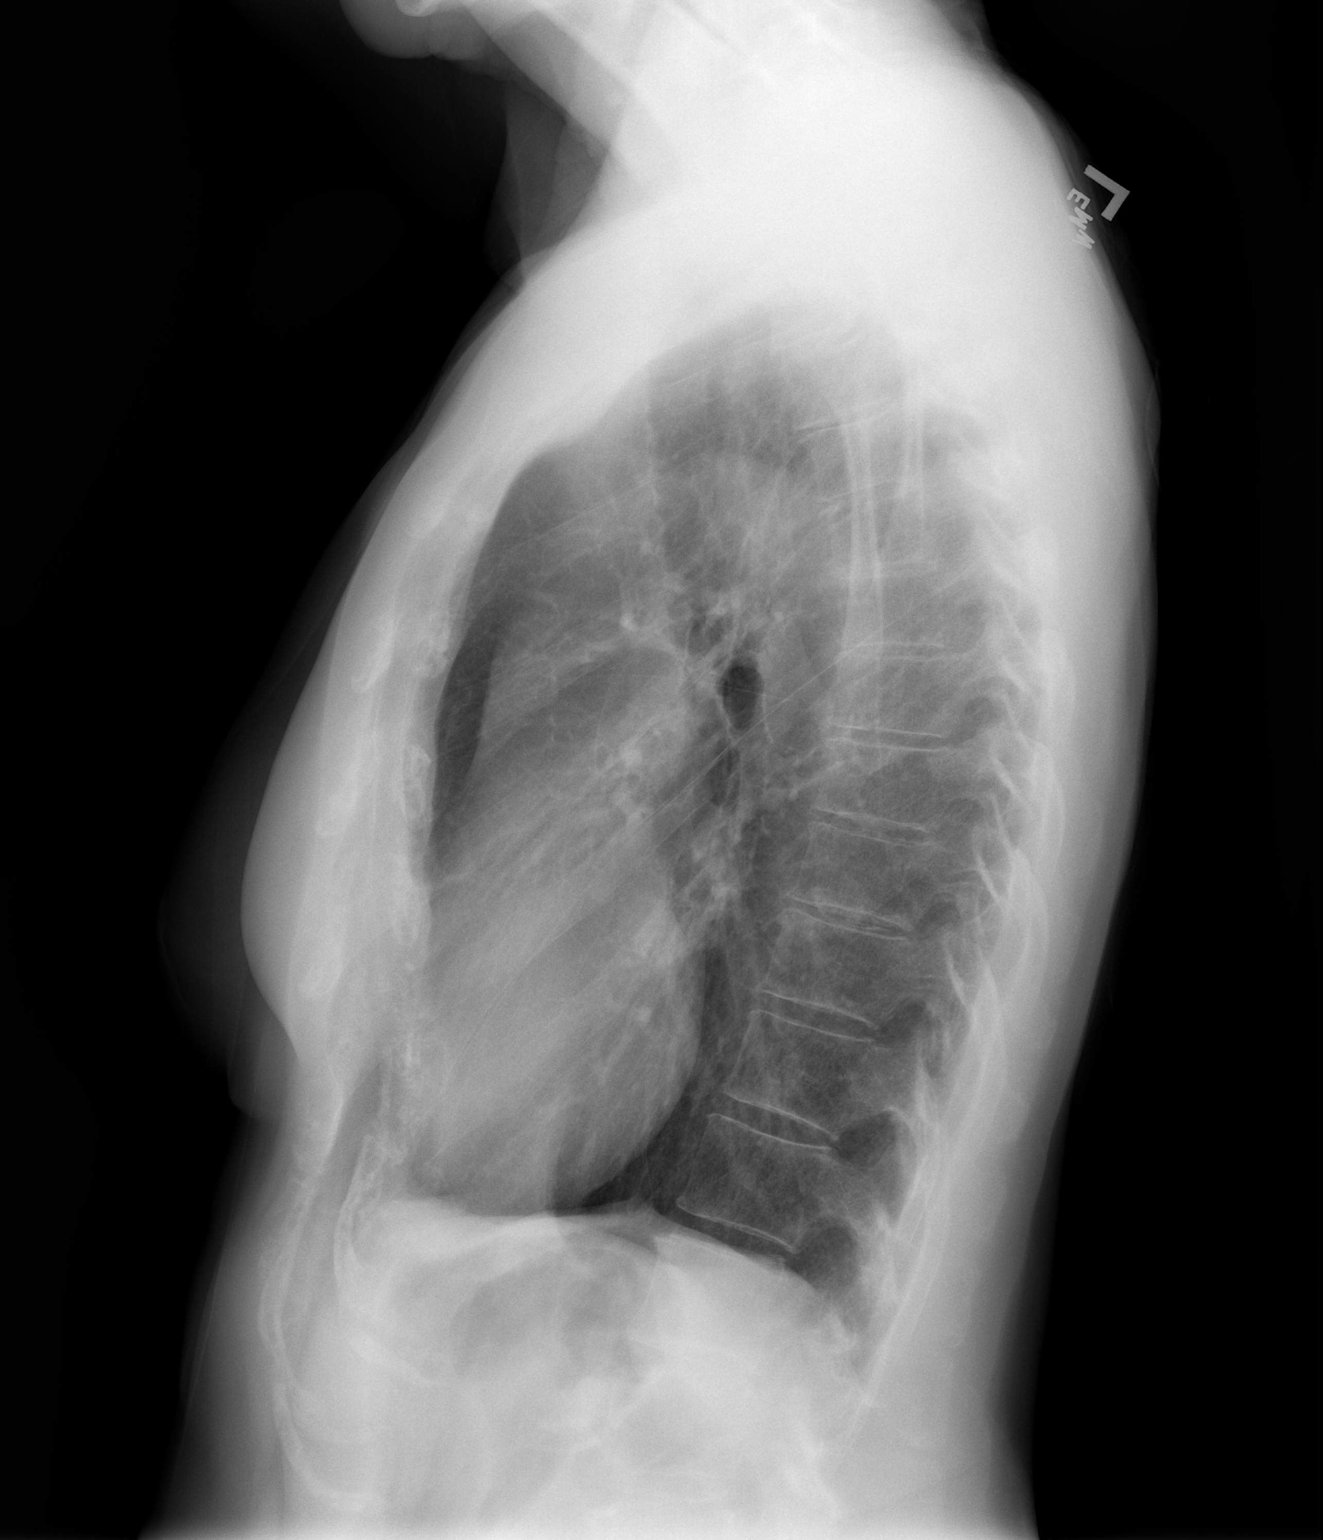

[2 of 2 positions shown; findings below may reference images not displayed]

FINDINGS: The heart size and mediastinal contours are within normal limits.
Both lungs are clear. The visualized skeletal structures are
unremarkable.
IMPRESSION: No active cardiopulmonary disease.

## 2019-09-05 IMAGING — DX DG CHEST 2V
2 series · 2 of 2 positions shown · non-contrast
Comparison: 08/25/2016

CLINICAL DATA: Chest pain

EXAM:
CHEST  2 VIEW

[w chest lat]
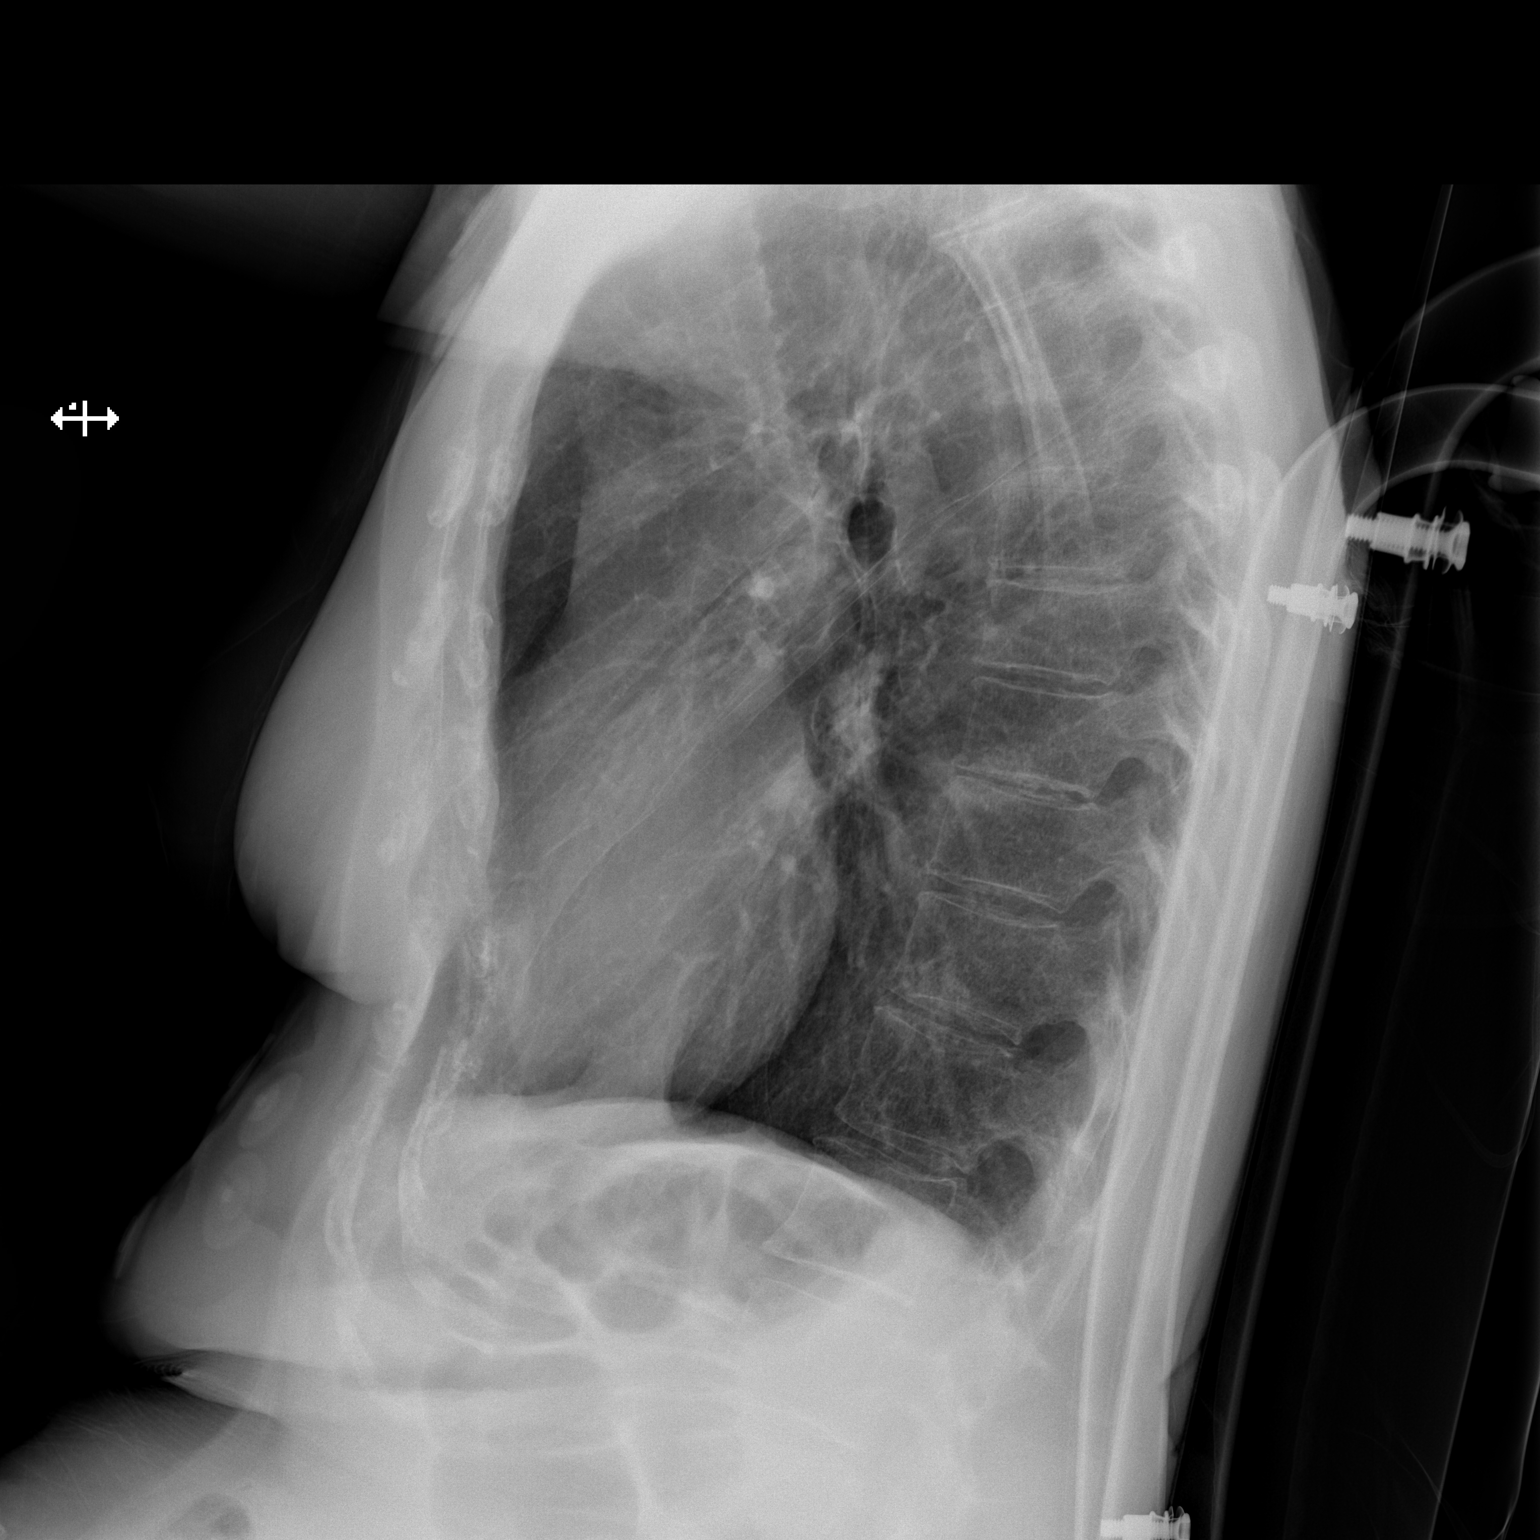

[x chest ap]
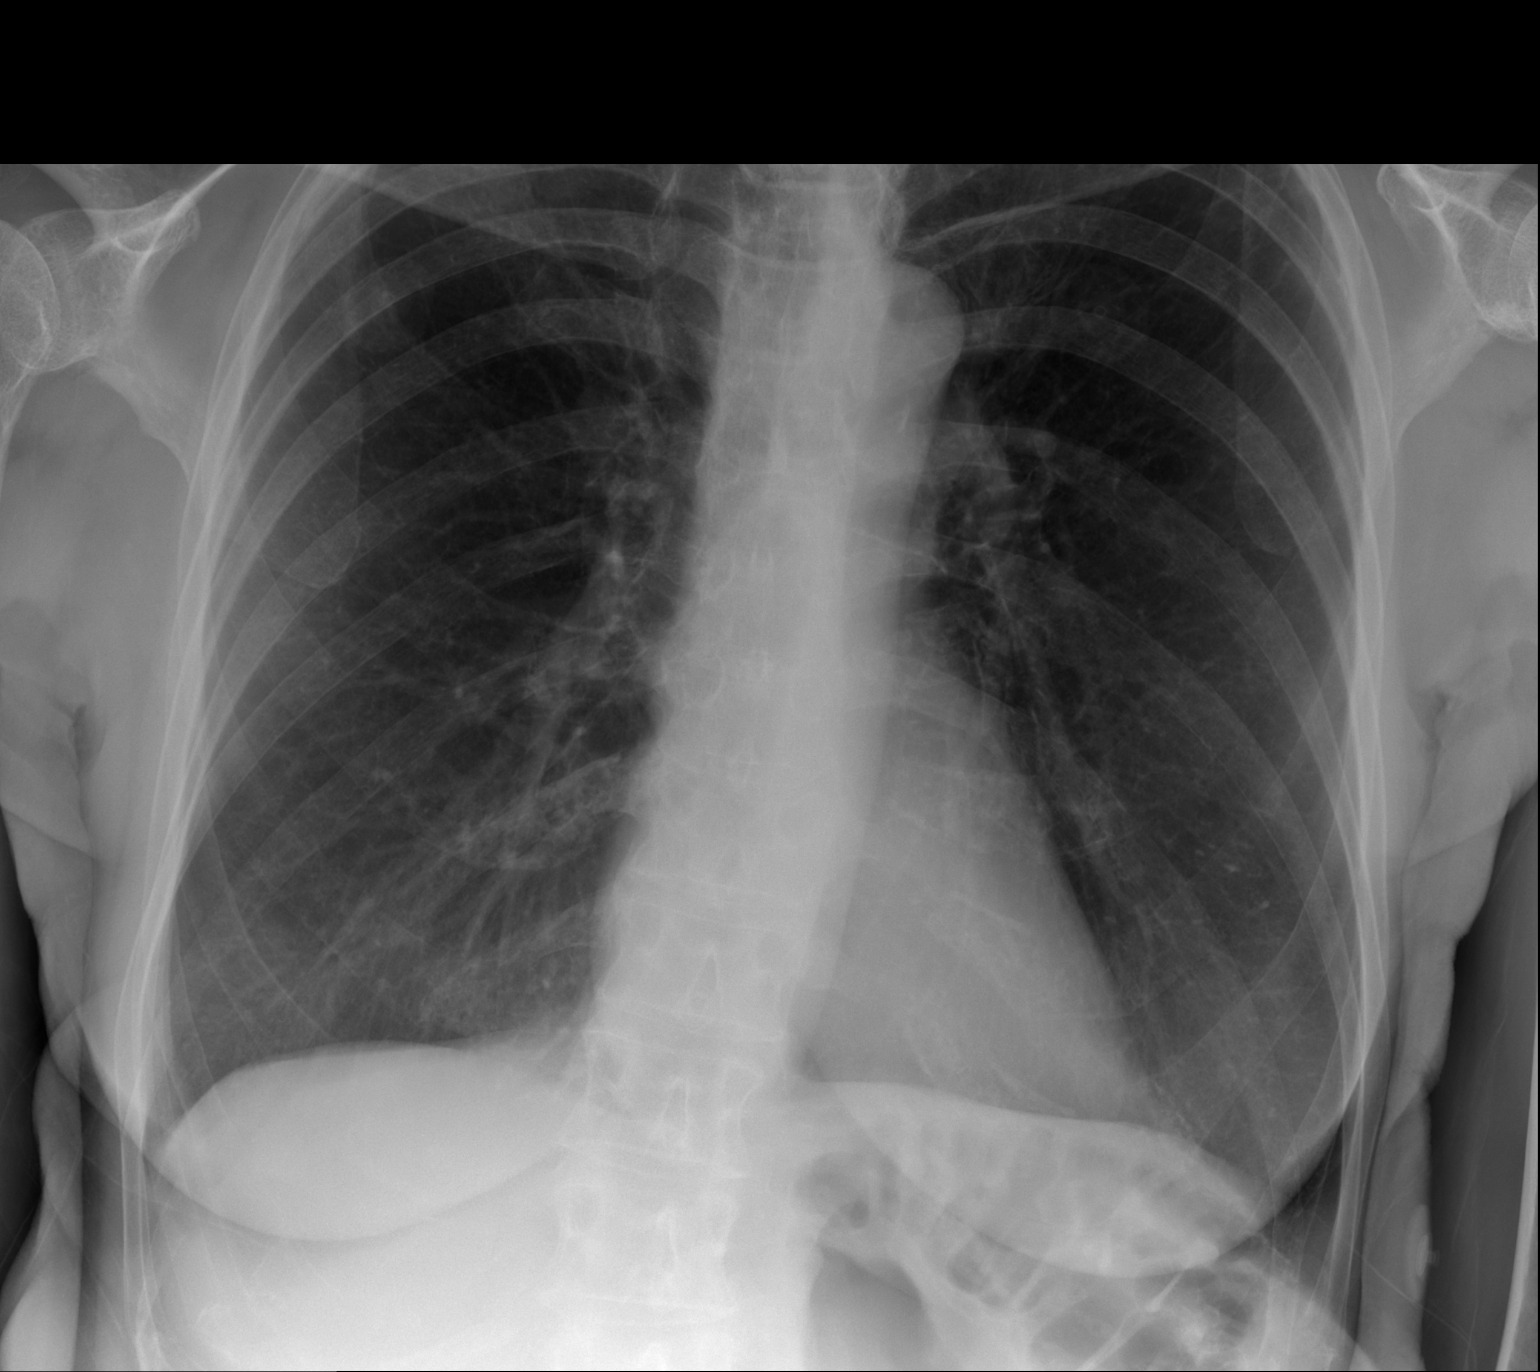

[2 of 2 positions shown; findings below may reference images not displayed]

FINDINGS: Non inclusion of the left lung a PACs. Hyperinflation. No acute
consolidation or pleural effusion. Normal cardiomediastinal
silhouette. No pneumothorax.
IMPRESSION: No active cardiopulmonary disease.
